# Patient Record
Sex: Male | Born: 1980 | Race: White | Hispanic: No | Marital: Married | Smoking: Current every day smoker
Health system: Southern US, Community
[De-identification: ages and names within clinical notes are randomized; demographics above are authoritative.]

## PROBLEM LIST (undated history)

## (undated) DIAGNOSIS — F419 Anxiety disorder, unspecified: Secondary | ICD-10-CM

## (undated) DIAGNOSIS — K219 Gastro-esophageal reflux disease without esophagitis: Secondary | ICD-10-CM

## (undated) DIAGNOSIS — J45909 Unspecified asthma, uncomplicated: Secondary | ICD-10-CM

## (undated) DIAGNOSIS — Z800 Family history of malignant neoplasm of digestive organs: Secondary | ICD-10-CM

## (undated) DIAGNOSIS — F329 Major depressive disorder, single episode, unspecified: Secondary | ICD-10-CM

## (undated) DIAGNOSIS — F32A Depression, unspecified: Secondary | ICD-10-CM

## (undated) DIAGNOSIS — T7840XA Allergy, unspecified, initial encounter: Secondary | ICD-10-CM

## (undated) DIAGNOSIS — G4730 Sleep apnea, unspecified: Secondary | ICD-10-CM

## (undated) HISTORY — DX: Depression, unspecified: F32.A

## (undated) HISTORY — PX: WISDOM TOOTH EXTRACTION: SHX21

## (undated) HISTORY — DX: Family history of malignant neoplasm of digestive organs: Z80.0

## (undated) HISTORY — DX: Unspecified asthma, uncomplicated: J45.909

## (undated) HISTORY — DX: Allergy, unspecified, initial encounter: T78.40XA

## (undated) HISTORY — DX: Gastro-esophageal reflux disease without esophagitis: K21.9

## (undated) HISTORY — DX: Anxiety disorder, unspecified: F41.9

## (undated) HISTORY — DX: Major depressive disorder, single episode, unspecified: F32.9

---

## 2006-07-15 ENCOUNTER — Ambulatory Visit: Payer: Self-pay | Admitting: Family Medicine

## 2006-08-12 ENCOUNTER — Ambulatory Visit: Payer: Self-pay | Admitting: Family Medicine

## 2007-07-12 ENCOUNTER — Ambulatory Visit: Payer: Self-pay | Admitting: Family Medicine

## 2007-07-12 DIAGNOSIS — K219 Gastro-esophageal reflux disease without esophagitis: Secondary | ICD-10-CM | POA: Insufficient documentation

## 2007-07-12 LAB — CONVERTED CEMR LAB: H Pylori IgG: NEGATIVE

## 2007-07-13 ENCOUNTER — Telehealth (INDEPENDENT_AMBULATORY_CARE_PROVIDER_SITE_OTHER): Payer: Self-pay | Admitting: *Deleted

## 2007-08-02 ENCOUNTER — Telehealth (INDEPENDENT_AMBULATORY_CARE_PROVIDER_SITE_OTHER): Payer: Self-pay | Admitting: *Deleted

## 2009-01-14 ENCOUNTER — Ambulatory Visit: Payer: Self-pay | Admitting: Family Medicine

## 2009-01-14 DIAGNOSIS — J309 Allergic rhinitis, unspecified: Secondary | ICD-10-CM | POA: Insufficient documentation

## 2009-01-14 DIAGNOSIS — F17200 Nicotine dependence, unspecified, uncomplicated: Secondary | ICD-10-CM | POA: Insufficient documentation

## 2009-01-31 ENCOUNTER — Ambulatory Visit: Payer: Self-pay | Admitting: Family Medicine

## 2009-01-31 DIAGNOSIS — H00019 Hordeolum externum unspecified eye, unspecified eyelid: Secondary | ICD-10-CM | POA: Insufficient documentation

## 2012-05-08 ENCOUNTER — Ambulatory Visit
Admission: RE | Admit: 2012-05-08 | Discharge: 2012-05-08 | Disposition: A | Discharge status: Home / Self Care | Payer: No Typology Code available for payment source | Source: Ambulatory Visit | Attending: Occupational Medicine | Admitting: Occupational Medicine

## 2012-05-08 ENCOUNTER — Other Ambulatory Visit: Payer: Self-pay | Admitting: Occupational Medicine

## 2012-05-08 DIAGNOSIS — Z0000 Encounter for general adult medical examination without abnormal findings: Secondary | ICD-10-CM

## 2017-01-05 ENCOUNTER — Ambulatory Visit (INDEPENDENT_AMBULATORY_CARE_PROVIDER_SITE_OTHER): Payer: Commercial Managed Care - HMO

## 2017-01-05 ENCOUNTER — Ambulatory Visit (INDEPENDENT_AMBULATORY_CARE_PROVIDER_SITE_OTHER): Payer: Commercial Managed Care - HMO | Admitting: Family Medicine

## 2017-01-05 ENCOUNTER — Encounter: Payer: Self-pay | Admitting: Family Medicine

## 2017-01-05 VITALS — BP 108/68 | HR 73 | Temp 98.4°F | Ht 69.0 in | Wt 210.0 lb

## 2017-01-05 DIAGNOSIS — G4730 Sleep apnea, unspecified: Secondary | ICD-10-CM | POA: Diagnosis not present

## 2017-01-05 DIAGNOSIS — R0602 Shortness of breath: Secondary | ICD-10-CM

## 2017-01-05 DIAGNOSIS — E669 Obesity, unspecified: Secondary | ICD-10-CM | POA: Diagnosis not present

## 2017-01-05 DIAGNOSIS — F17200 Nicotine dependence, unspecified, uncomplicated: Secondary | ICD-10-CM | POA: Diagnosis not present

## 2017-01-05 DIAGNOSIS — J301 Allergic rhinitis due to pollen: Secondary | ICD-10-CM | POA: Diagnosis not present

## 2017-01-05 DIAGNOSIS — E782 Mixed hyperlipidemia: Secondary | ICD-10-CM | POA: Insufficient documentation

## 2017-01-05 DIAGNOSIS — G4733 Obstructive sleep apnea (adult) (pediatric): Secondary | ICD-10-CM | POA: Insufficient documentation

## 2017-01-05 DIAGNOSIS — F329 Major depressive disorder, single episode, unspecified: Secondary | ICD-10-CM | POA: Insufficient documentation

## 2017-01-05 DIAGNOSIS — R5382 Chronic fatigue, unspecified: Secondary | ICD-10-CM | POA: Diagnosis not present

## 2017-01-05 DIAGNOSIS — F411 Generalized anxiety disorder: Secondary | ICD-10-CM | POA: Insufficient documentation

## 2017-01-05 LAB — LIPID PANEL
Cholesterol: 239 mg/dL — ABNORMAL HIGH (ref 0–200)
HDL: 37.5 mg/dL — ABNORMAL LOW (ref >39.00)
NonHDL: 201.04
Total CHOL/HDL Ratio: 6
Triglycerides: 265 mg/dL — ABNORMAL HIGH (ref 0.0–149.0)
VLDL: 53 mg/dL — ABNORMAL HIGH (ref 0.0–40.0)

## 2017-01-05 LAB — COMPREHENSIVE METABOLIC PANEL
ALT: 32 U/L (ref 0–53)
AST: 26 U/L (ref 0–37)
Albumin: 4.9 g/dL (ref 3.5–5.2)
Alkaline Phosphatase: 64 U/L (ref 39–117)
BUN: 11 mg/dL (ref 6–23)
CO2: 29 mEq/L (ref 19–32)
Calcium: 9.7 mg/dL (ref 8.4–10.5)
Chloride: 105 mEq/L (ref 96–112)
Creatinine, Ser: 1.02 mg/dL (ref 0.40–1.50)
GFR: 87.49 mL/min (ref >60.00)
Glucose, Bld: 87 mg/dL (ref 70–99)
Potassium: 3.9 mEq/L (ref 3.5–5.1)
Sodium: 139 mEq/L (ref 135–145)
Total Bilirubin: 0.7 mg/dL (ref 0.2–1.2)
Total Protein: 7.3 g/dL (ref 6.0–8.3)

## 2017-01-05 LAB — TSH: TSH: 1.77 u[IU]/mL (ref 0.35–4.50)

## 2017-01-05 LAB — TESTOSTERONE: Testosterone: 278.35 ng/dL — ABNORMAL LOW (ref 300.00–890.00)

## 2017-01-05 LAB — LDL CHOLESTEROL, DIRECT: Direct LDL: 147 mg/dL

## 2017-01-05 LAB — T4, FREE: Free T4: 0.82 ng/dL (ref 0.60–1.60)

## 2017-01-05 NOTE — Progress Notes (Signed)
Pre visit review using our clinic review tool, if applicable. No additional management support is needed unless otherwise documented below in the visit note. 

## 2017-01-05 NOTE — Progress Notes (Signed)
Jesse Arellano is a 36 y.o. male is here to [facility] C[facility].   History of Present Illness:    1. Chronic fatigue. Worse over the past few months. Patient is a IT sales professional. Alternates shifts. Has not been feeling up to exercising with his group. Cardiovascular ROS: negative for - chest pain, dyspnea on exertion, edema, irregular heartbeat or palpitations. Endorses chronic stress and anxiety (see below). Endorses apnea symptoms (see below). Smoker. Wants testosterone checked. Endorses ETOH regularly, but less than a six pack per week. No drug use. No supplement use. Sleep 6-11 hours per night.    2. Sleep disorder breathing. Snores loudly and has been told that he stops breathing. Had "uvula trimmed" about 10 years ago. No previous sleep study.    3. Obesity (BMI 30-39.9). He has gained weight over the past year due to lack of exercise and increased eating.     4. Smoker. Daily. Worse when he is anxious.    5. SOB (shortness of breath). Intermittent. Hx of allergies. Smoker. Respiratory ROS: negative for - hemoptysis, pleuritic pain, sputum changes, tachypnea or wheezing.   6. Mixed hyperlipidemia. Hx of. Not on medication.    7. Chronic seasonal allergic rhinitis due to pollen. Zyrtec prn.    8. Reactive depression. Seeing psychology, CBT.    9. Generalized anxiety disorder. Seeing psychology, CBT.     There are no preventive care reminders to display for this patient.   PMHx, SurgHx, SocialHx, Medications, and Allergies were reviewed in the Visit Navigator and updated as appropriate.    Past Medical History:  Diagnosis Date  . Allergy   . Anxiety   . Asthma   . Depression   . GERD (gastroesophageal reflux disease)   . Substance abuse     No past surgical history on file.  Family History  Problem Relation Age of Onset  . Diabetes Mother   . Cancer Father   . Diabetes Sister   . Cancer Sister   . Behavior problems Daughter     Social History  Substance Use  Topics  . Smoking status: Current Every Day Smoker  . Smokeless tobacco: Current User  . Alcohol use 3.0 oz/week    5 Cans of beer per week     Current Medications and Allergies:    Current Outpatient Prescriptions:  .  cetirizine (ZYRTEC) 10 MG tablet, Take 10 mg by mouth daily., Disp: , Rfl:  .  ibuprofen (ADVIL,MOTRIN) 200 MG tablet, Take 400 mg by mouth every 6 (six) hours as needed., Disp: , Rfl:    Allergies  Allergen Reactions  . Penicillins     REACTION: hives and itching as child      Patient Information Form: Screening and ROS    Do you feel safe in relationships? yes PHQ-2: positive  Review of Systems  General:  Negative for unexplained weight loss, fever Skin: Negative for new or changing mole, sore that won't heal HEENT: Negative for trouble hearing, trouble seeing, ringing in ears, mouth sores, hoarseness, change in voice, dysphagia CV:  Negative for chest pain, edema, palpitations Resp: Negative for cough, dyspnea, hemoptysis GI: Negative for nausea, vomiting, diarrhea, constipation, abdominal pain, melena, hematochezia GU: Negative for dysuria, incontinence, urinary hesitance, hematuria, penile discharge, polyuria, sexual difficulty, lumps in testicle MSK: Negative for muscle cramps or aches, joint pain or swelling Neuro: Negative for headaches, weakness, numbness, dizziness, passing out/fainting Psych: Negative for memory problems, SI/HI   Vitals:   Vitals:   01/05/17 1610  BP: 108/68  Pulse: 73  Temp: 98.4 F (36.9 C)  TempSrc: Oral  SpO2: 97%  Weight: 210 lb (95.3 kg)  Height: 5\' 9"  (1.753 m)     Body mass index is 31.01 kg/m.   Physical Exam:     General: Alert, cooperative, appears stated age and no distress.  HEENT:  Normocephalic, without obvious abnormality, atraumatic. Conjunctivae/corneas clear. PERRL, EOM's intact. Normal TM's and external ear canals both ears. Nares normal. Septum midline. Mucosa normal. No drainage or sinus  tenderness. Lips, mucosa, and tongue normal; teeth and gums normal.  Lungs: Clear to auscultation bilaterally.  Heart:: Regular rate and rhythm, S1, S2 normal, no murmur, click, rub or gallop.  Abdomen: Soft, non-tender; bowel sounds normal; no masses,  no organomegaly.  Extremities: Extremities normal, atraumatic, no cyanosis or edema.  Pulses: 2+ and symmetric.  Skin: Skin color, texture, turgor normal. No rashes or lesions.  Neurologic: Alert and oriented X 3, normal strength and tone. Normal symmetric. reflexes. Normal coordination and gait.  Psych: Alert,oriented, in NAD with a full range of affect, normal behavior and no psychotic features     Assessment and Plan:    Madelaine Bhatdam was seen today for establish care and fatigue.  Diagnoses and all orders for this visit:  Chronic fatigue Comments: Labs as below. Home sleep study. Reviewed healthy eating patterns, exercise, and stress reduction. Patient requested testosterone, though not early in am. I did let him know that my first recommendation will be 10% weight loss and treatment of sleep apnea if diagnosed prior to replacement.  Orders: -     Testosterone -     TSH -     T4, free  Sleep disorder breathing Comments: Direct observations from wife and coworkers. Orders: -     Nocturnal polysomnography (NPSG); Future  Obesity (BMI 30-39.9) Comments: As above.  Smoker SOB (shortness of breath) Comments: Clear CXR today. Reviewed precautions. PFTs with CPE each year. Orders: -     DG Chest 2 View  Mixed hyperlipidemia -     Comprehensive metabolic panel -     Lipid panel  Chronic seasonal allergic rhinitis due to pollen  Reactive depression Generalized anxiety disorder Comments: Continue CBT. He has tried multiple medications in the past and would like to avoid them at this time. No SI/HI.    Marland Kitchen. Reviewed expectations re: course of current medical issues. . Discussed self-management of symptoms. . Outlined signs and  symptoms indicating need for more acute intervention. . Patient verbalized understanding and all questions were answered. . See orders for this visit as documented in the electronic medical record. . Patient received an After Visit Summary.  Records requested if needed. I spent 60 minutes with this patient, greater than 50% was face-to-face time counseling regarding the above diagnoses.    Helane RimaErica Maureen Duesing, D.O. [place] [place] 01/05/2017   Follow-up: No Follow-up on file.  Meds ordered this encounter  Medications  . cetirizine (ZYRTEC) 10 MG tablet    Sig: Take 10 mg by mouth daily.  Marland Kitchen. ibuprofen (ADVIL,MOTRIN) 200 MG tablet    Sig: Take 400 mg by mouth every 6 (six) hours as needed.   There are no discontinued medications. Orders Placed This Encounter  Procedures  . DG Chest 2 View  . Comprehensive metabolic panel  . Lipid panel  . Testosterone  . TSH  . T4, free  . Nocturnal polysomnography (NPSG)

## 2017-01-06 ENCOUNTER — Encounter: Payer: Self-pay | Admitting: Family Medicine

## 2017-01-21 ENCOUNTER — Telehealth: Payer: Self-pay | Admitting: Internal Medicine

## 2017-01-21 NOTE — Telephone Encounter (Signed)
I had to leave voice message for patient to return my call to schedule the HST

## 2017-01-27 NOTE — Telephone Encounter (Signed)
Pt has been scheduled with Synetta FailAnita for next Friday 3/23.

## 2017-01-27 NOTE — Telephone Encounter (Signed)
Please advise if pt has been contacted again about HST. Thanks.

## 2017-02-06 DIAGNOSIS — G4733 Obstructive sleep apnea (adult) (pediatric): Secondary | ICD-10-CM

## 2017-02-16 ENCOUNTER — Telehealth: Payer: Self-pay | Admitting: Pulmonary Disease

## 2017-02-16 DIAGNOSIS — G4733 Obstructive sleep apnea (adult) (pediatric): Secondary | ICD-10-CM

## 2017-02-16 NOTE — Telephone Encounter (Signed)
Spoke with patient regarding results. Patient verbalized understanding. Wants to get started on a CPAP. Will place the order for the CPAP titration study.

## 2017-02-16 NOTE — Telephone Encounter (Signed)
Per Dr. Vassie Loll, HST showed severe OSA, especially in the spine position.   Treatment options include weight loss or CPAP therapy. Based on the severity, CPAP titration study should be ordered if he is interested.

## 2017-02-17 ENCOUNTER — Other Ambulatory Visit: Payer: Self-pay | Admitting: *Deleted

## 2017-02-17 DIAGNOSIS — G4730 Sleep apnea, unspecified: Secondary | ICD-10-CM

## 2017-02-20 ENCOUNTER — Encounter: Payer: Self-pay | Admitting: Family Medicine

## 2017-04-07 ENCOUNTER — Encounter (HOSPITAL_BASED_OUTPATIENT_CLINIC_OR_DEPARTMENT_OTHER): Payer: Commercial Managed Care - HMO

## 2017-04-10 IMAGING — DX DG CHEST 2V
2 series · 2 of 2 positions shown · non-contrast
Comparison: None.

CLINICAL DATA: Intermittent shortness of breath for 2-3 months.

EXAM:
CHEST  2 VIEW

[chest pa]
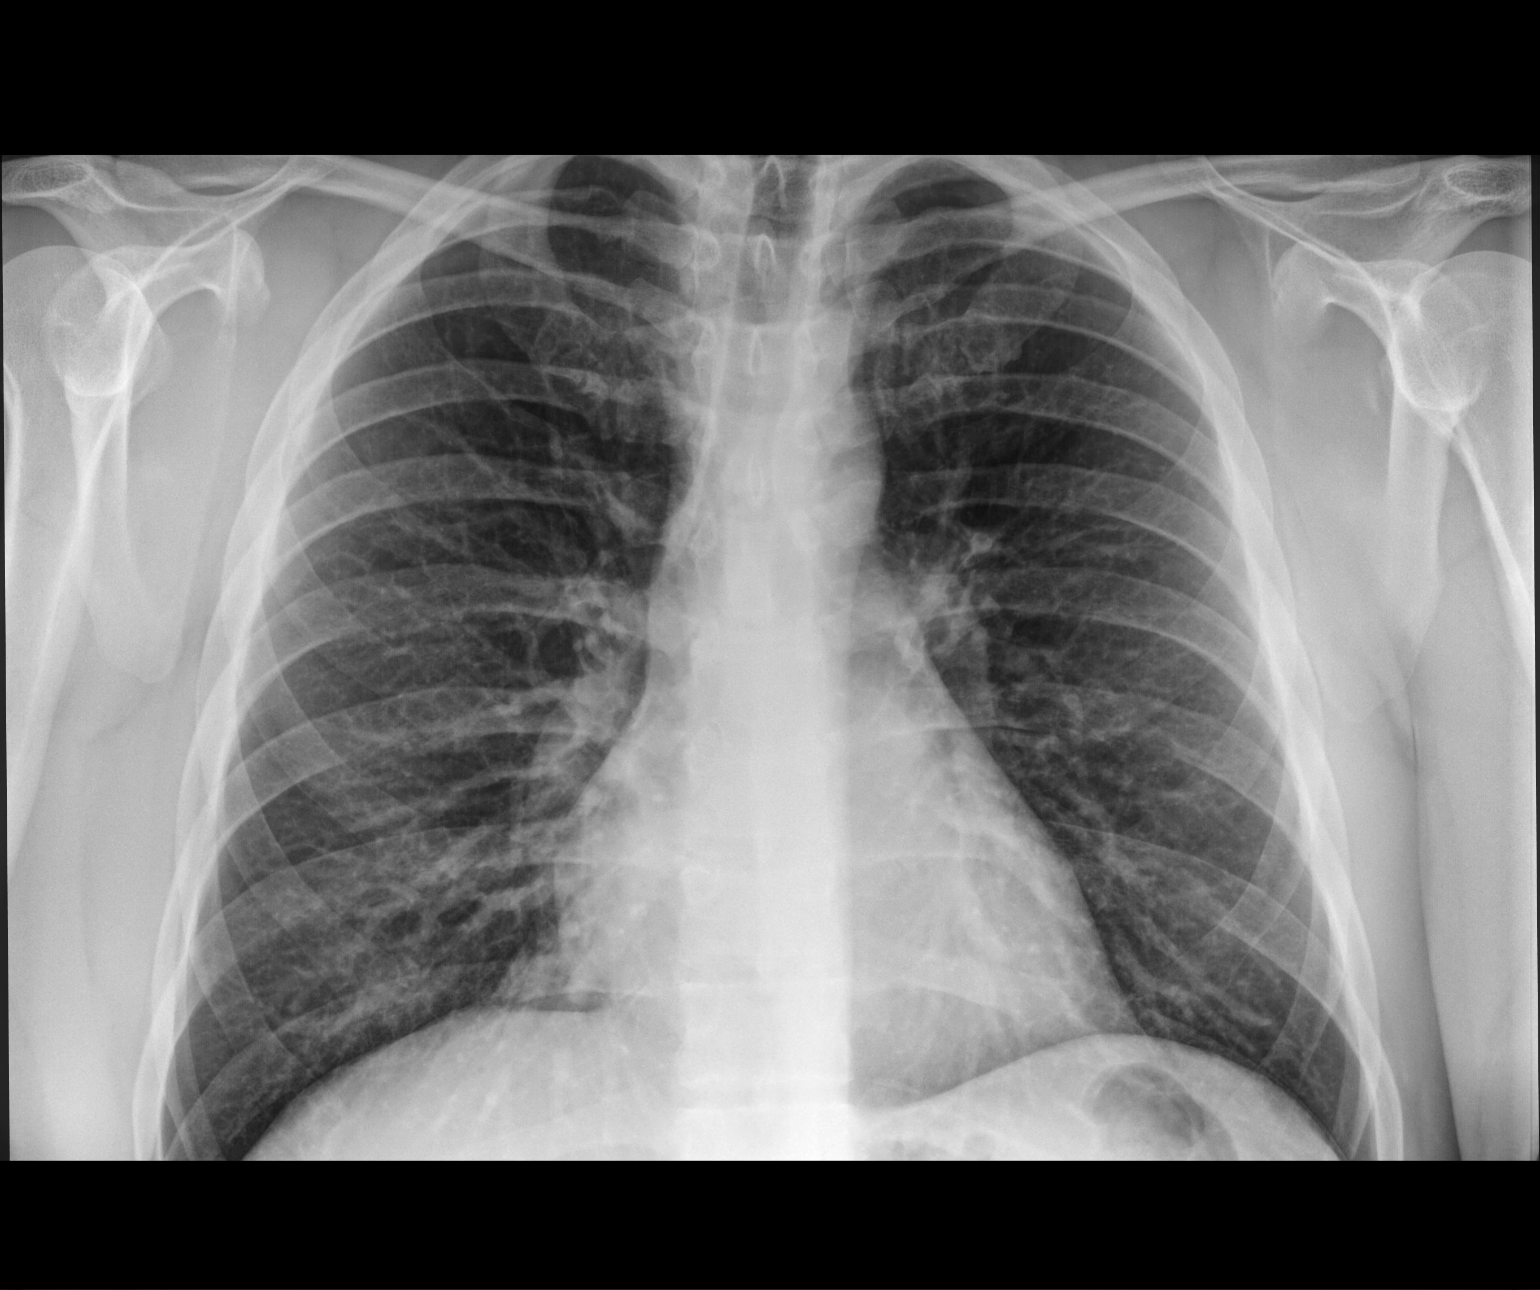

[chest lat]
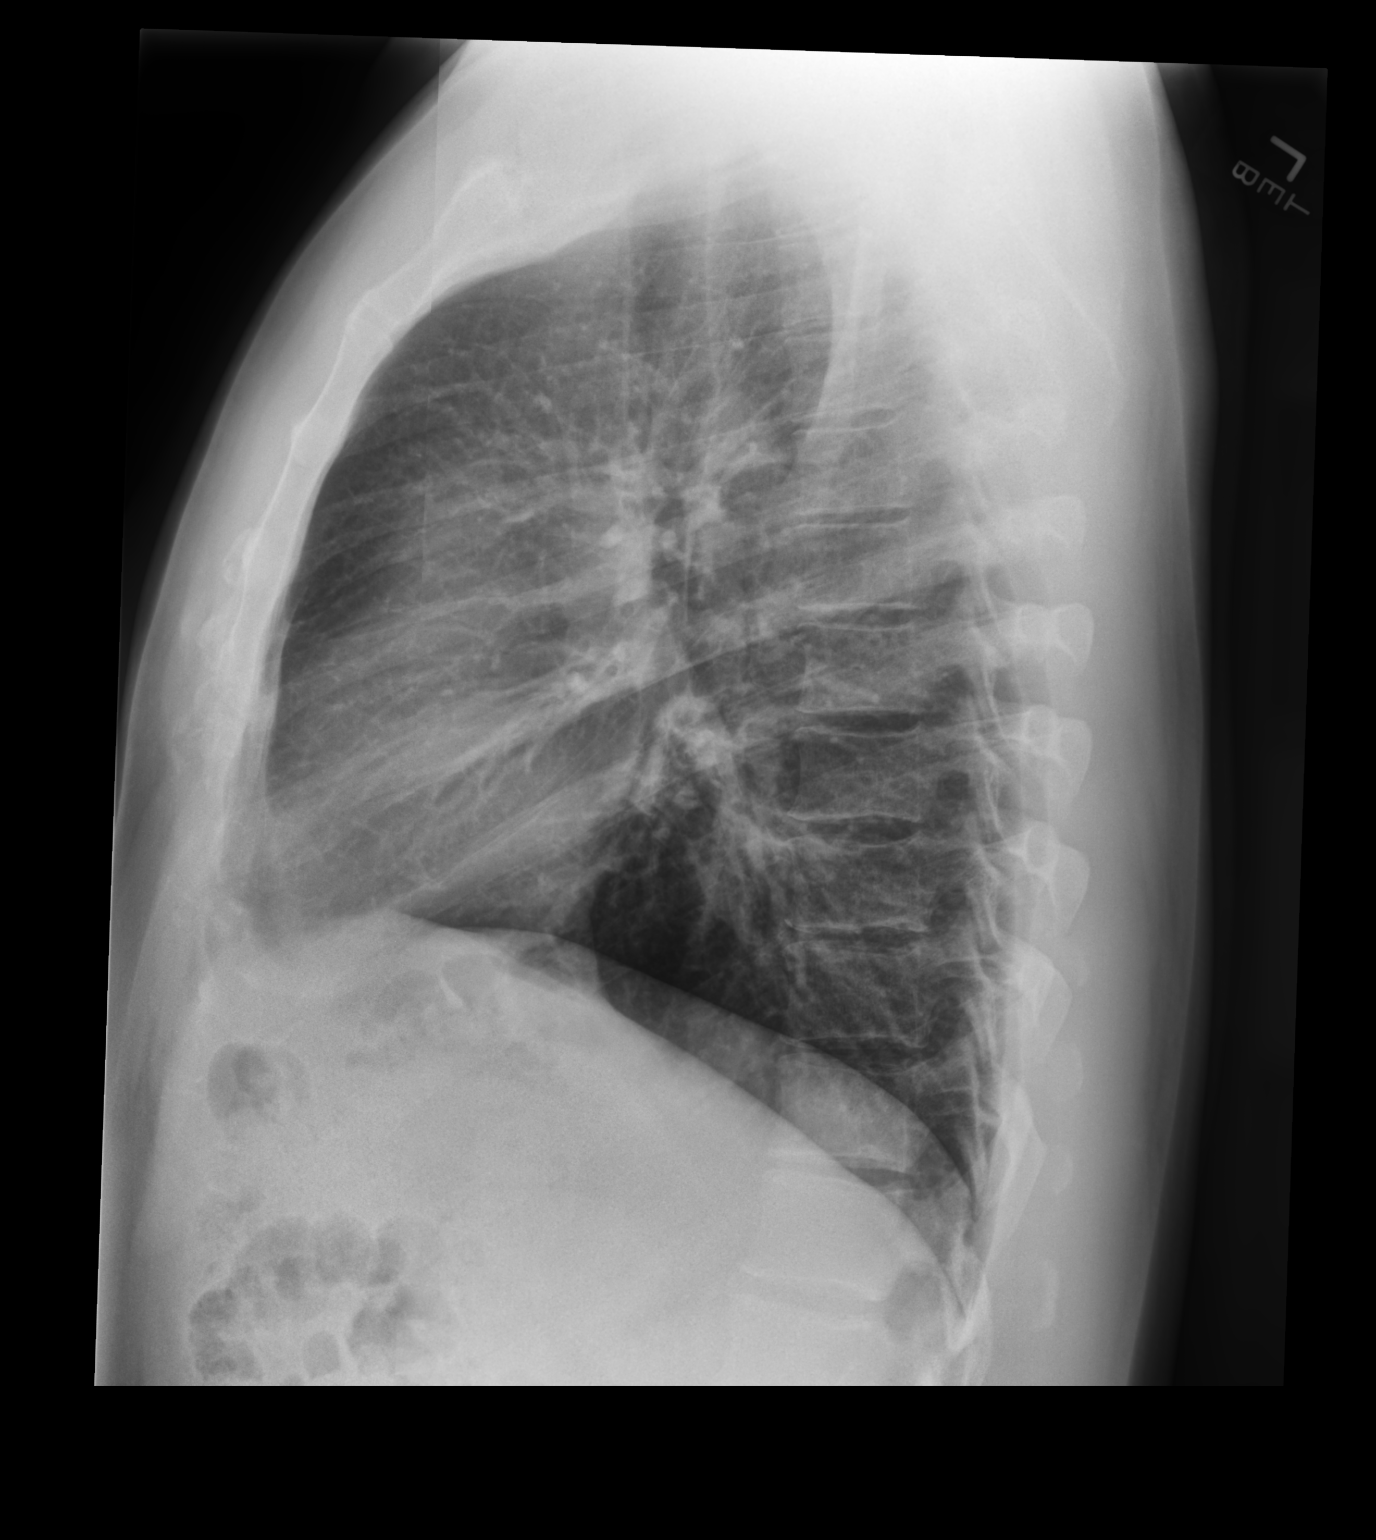

[2 of 2 positions shown; findings below may reference images not displayed]

FINDINGS: Normal heart size and mediastinal contours. No acute infiltrate or
edema. No effusion or pneumothorax. No acute osseous findings.
IMPRESSION: Negative chest.

## 2017-04-27 ENCOUNTER — Encounter (HOSPITAL_BASED_OUTPATIENT_CLINIC_OR_DEPARTMENT_OTHER): Payer: Commercial Managed Care - HMO

## 2018-06-02 ENCOUNTER — Encounter: Payer: Commercial Managed Care - HMO | Admitting: Family Medicine

## 2018-06-25 DIAGNOSIS — R7989 Other specified abnormal findings of blood chemistry: Secondary | ICD-10-CM | POA: Insufficient documentation

## 2018-06-25 NOTE — Progress Notes (Signed)
I acted as a Neurosurgeonscribe for W. R. BerkleyErica Tifany Hirsch, DO Corky Mullonna Orphanos, LPN  Subjective:    Jesse Arellano is a 37 y.o. male who presents today for his Complete Annual Exam.   Current Outpatient Medications:  .  cetirizine (ZYRTEC) 10 MG tablet, Take 10 mg by mouth daily., Disp: , Rfl:   There are no preventive care reminders to display for this patient.  PMHx, SurgHx, SocialHx, Medications, and Allergies were reviewed in the Visit Navigator and updated as appropriate.   Past Medical History:  Diagnosis Date  . Allergy   . Anxiety   . Asthma   . Depression   . GERD (gastroesophageal reflux disease)   . Substance abuse (HCC)    History reviewed. No pertinent surgical history.   Family History  Problem Relation Age of Onset  . Diabetes Mother   . Cancer Father   . Diabetes Sister   . Cancer Sister   . Behavior problems Daughter    Social History   Tobacco Use  . Smoking status: Current Every Day Smoker  . Smokeless tobacco: Current User  Substance Use Topics  . Alcohol use: Yes    Alcohol/week: 5.0 standard drinks    Types: 5 Cans of beer per week  . Drug use: No   Review of Systems:   Pertinent items are noted in the HPI. Otherwise, ROS is negative.  Objective:   Vitals:   06/26/18 0715  BP: 130/90  Pulse: 75  Temp: 98.3 F (36.8 C)  SpO2: 96%   Body mass index is 30.46 kg/m.  General Appearance:  Alert, cooperative, no distress, appears stated age  Head:  Normocephalic, without obvious abnormality, atraumatic  Eyes:  PERRL, conjunctiva/corneas clear, EOM's intact, fundi benign, both eyes       Ears:  Normal TM's and external ear canals, both ears  Nose: Nares normal, septum midline, mucosa normal, no drainage    or sinus tenderness  Throat: Lips, mucosa, and tongue normal; teeth and gums normal  Neck: Supple, symmetrical, trachea midline, no adenopathy; thyroid:  No enlargement/tenderness/nodules; no carotit bruit or JVD  Back:   Symmetric, no  curvature, ROM normal, no CVA tenderness  Lungs:   Clear to auscultation bilaterally, respirations unlabored  Chest wall:  No tenderness or deformity  Heart:  Regular rate and rhythm, S1 and S2 normal, no murmur, rub   or gallop  Abdomen:   Soft, non-tender, bowel sounds active all four quadrants, no masses, no organomegaly  Extremities: Extremities normal, atraumatic, no cyanosis or edema  Prostate: Not done.   Skin: Skin color, texture, turgor normal, no rashes or lesions  Lymph nodes: Cervical, supraclavicular, and axillary nodes normal  Neurologic: CNII-XII grossly intact. Normal strength, sensation and reflexes throughout   Assessment/Plan:   Jesse Arellano was seen today for annual exam.  Diagnoses and all orders for this visit:  Mixed hyperlipidemia -     Comprehensive metabolic panel -     Lipid panel  Low testosterone  OSA (obstructive sleep apnea), severe  Obesity (BMI 30-39.9)  Tonsil and adenoid disease, chronic -     CBC with Differential/Platelet -     Ambulatory referral to ENT  Ganglion cyst -     Ambulatory referral to Orthopedics  Cigarette nicotine dependence without complication -     varenicline (CHANTIX STARTING MONTH PAK) 0.5 MG X 11 & 1 MG X 42 tablet; Take one 0.5 mg tablet by mouth once daily for 3 days, then increase to one 0.5  mg tablet twice daily for 4 days, then increase to one 1 mg tablet twice daily.    Patient Counseling: [x]   Nutrition: Stressed importance of moderation in sodium/caffeine intake, saturated fat and cholesterol, caloric balance, sufficient intake of fresh fruits, vegetables, and fiber.  [x]   Stressed the importance of regular exercise.   []   Substance Abuse: Discussed cessation/primary prevention of tobacco, alcohol, or other drug use; driving or other dangerous activities under the influence; availability of treatment for abuse.   [x]   Injury prevention: Discussed safety belts, safety helmets, smoke detector, smoking near bedding or  upholstery.   []   Sexuality: Discussed sexually transmitted diseases, partner selection, use of condoms, avoidance of unintended pregnancy and contraceptive alternatives.   [x]   Dental health: Discussed importance of regular tooth brushing, flossing, and dental visits.  [x]   Health maintenance and immunizations reviewed. Please refer to Health maintenance section.    Helane Rima, DO [place] [facility]

## 2018-06-26 ENCOUNTER — Ambulatory Visit (INDEPENDENT_AMBULATORY_CARE_PROVIDER_SITE_OTHER): Payer: 59 | Admitting: Family Medicine

## 2018-06-26 ENCOUNTER — Encounter: Payer: Self-pay | Admitting: Family Medicine

## 2018-06-26 VITALS — BP 130/90 | HR 75 | Temp 98.3°F | Ht 69.0 in | Wt 206.2 lb

## 2018-06-26 DIAGNOSIS — R7989 Other specified abnormal findings of blood chemistry: Secondary | ICD-10-CM | POA: Diagnosis not present

## 2018-06-26 DIAGNOSIS — J359 Chronic disease of tonsils and adenoids, unspecified: Secondary | ICD-10-CM | POA: Diagnosis not present

## 2018-06-26 DIAGNOSIS — E782 Mixed hyperlipidemia: Secondary | ICD-10-CM

## 2018-06-26 DIAGNOSIS — G4733 Obstructive sleep apnea (adult) (pediatric): Secondary | ICD-10-CM

## 2018-06-26 DIAGNOSIS — M6740 Ganglion, unspecified site: Secondary | ICD-10-CM

## 2018-06-26 DIAGNOSIS — E669 Obesity, unspecified: Secondary | ICD-10-CM

## 2018-06-26 DIAGNOSIS — F17210 Nicotine dependence, cigarettes, uncomplicated: Secondary | ICD-10-CM

## 2018-06-26 LAB — CBC WITH DIFFERENTIAL/PLATELET
Basophils Absolute: 0 10*3/uL (ref 0.0–0.1)
Basophils Relative: 0.6 % (ref 0.0–3.0)
Eosinophils Absolute: 0.4 10*3/uL (ref 0.0–0.7)
Eosinophils Relative: 6 % — ABNORMAL HIGH (ref 0.0–5.0)
HCT: 44.9 % (ref 39.0–52.0)
Hemoglobin: 15.7 g/dL (ref 13.0–17.0)
Lymphocytes Relative: 23.6 % (ref 12.0–46.0)
Lymphs Abs: 1.5 10*3/uL (ref 0.7–4.0)
MCHC: 35.1 g/dL (ref 30.0–36.0)
MCV: 87.8 fl (ref 78.0–100.0)
Monocytes Absolute: 0.6 10*3/uL (ref 0.1–1.0)
Monocytes Relative: 9 % (ref 3.0–12.0)
Neutro Abs: 3.9 10*3/uL (ref 1.4–7.7)
Neutrophils Relative %: 60.8 % (ref 43.0–77.0)
Platelets: 206 10*3/uL (ref 150.0–400.0)
RBC: 5.11 Mil/uL (ref 4.22–5.81)
RDW: 13.6 % (ref 11.5–15.5)
WBC: 6.4 10*3/uL (ref 4.0–10.5)

## 2018-06-26 LAB — LIPID PANEL
Cholesterol: 213 mg/dL — ABNORMAL HIGH (ref 0–200)
HDL: 35.6 mg/dL — ABNORMAL LOW (ref >39.00)
NonHDL: 177.31
Total CHOL/HDL Ratio: 6
Triglycerides: 261 mg/dL — ABNORMAL HIGH (ref 0.0–149.0)
VLDL: 52.2 mg/dL — ABNORMAL HIGH (ref 0.0–40.0)

## 2018-06-26 LAB — COMPREHENSIVE METABOLIC PANEL
ALT: 23 U/L (ref 0–53)
AST: 19 U/L (ref 0–37)
Albumin: 4.7 g/dL (ref 3.5–5.2)
Alkaline Phosphatase: 61 U/L (ref 39–117)
BUN: 14 mg/dL (ref 6–23)
CO2: 28 mEq/L (ref 19–32)
Calcium: 10 mg/dL (ref 8.4–10.5)
Chloride: 105 mEq/L (ref 96–112)
Creatinine, Ser: 1.23 mg/dL (ref 0.40–1.50)
GFR: 69.93 mL/min (ref >60.00)
Glucose, Bld: 93 mg/dL (ref 70–99)
Potassium: 4.1 mEq/L (ref 3.5–5.1)
Sodium: 140 mEq/L (ref 135–145)
Total Bilirubin: 0.8 mg/dL (ref 0.2–1.2)
Total Protein: 7 g/dL (ref 6.0–8.3)

## 2018-06-26 LAB — LDL CHOLESTEROL, DIRECT: Direct LDL: 146 mg/dL

## 2018-06-26 MED ORDER — VARENICLINE TARTRATE 0.5 MG X 11 & 1 MG X 42 PO MISC: ORAL | 0 refills | Status: DC

## 2018-06-27 ENCOUNTER — Ambulatory Visit (INDEPENDENT_AMBULATORY_CARE_PROVIDER_SITE_OTHER): Payer: 59 | Admitting: Orthopaedic Surgery

## 2018-06-27 ENCOUNTER — Ambulatory Visit (INDEPENDENT_AMBULATORY_CARE_PROVIDER_SITE_OTHER): Payer: Self-pay

## 2018-06-27 ENCOUNTER — Encounter (INDEPENDENT_AMBULATORY_CARE_PROVIDER_SITE_OTHER): Payer: Self-pay | Admitting: Orthopaedic Surgery

## 2018-06-27 DIAGNOSIS — M25532 Pain in left wrist: Secondary | ICD-10-CM
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
## 2018-07-10 DIAGNOSIS — J351 Hypertrophy of tonsils: Secondary | ICD-10-CM | POA: Insufficient documentation

## 2018-07-13 ENCOUNTER — Encounter: Payer: Self-pay | Admitting: Family Medicine

## 2018-08-01 ENCOUNTER — Encounter: Payer: Self-pay | Admitting: Family Medicine

## 2018-08-03 ENCOUNTER — Other Ambulatory Visit: Payer: Self-pay

## 2018-08-03 DIAGNOSIS — G4733 Obstructive sleep apnea (adult) (pediatric): Secondary | ICD-10-CM

## 2018-08-03 DIAGNOSIS — J351 Hypertrophy of tonsils: Secondary | ICD-10-CM

## 2018-08-04 MED ORDER — AMPHETAMINE-DEXTROAMPHET ER 10 MG PO CP24: 10.0000 mg | ORAL_CAPSULE | Freq: Every day | ORAL | 0 refills | Status: DC

## 2018-08-04 NOTE — Addendum Note (Signed)
Addended by: Helane RimaWALLACE, Zykiria Bruening R on: 08/04/2018 02:11 PM   Modules accepted: Orders

## 2018-08-04 NOTE — Telephone Encounter (Signed)
Pt called in and states that he went to pick up Adderall and it is on back order.  Pharmacy states it could be 5 to 7 days before they have it in.  Pt also has questions about the difference between Adderall and Adderall XR.    Pt can be reached at [phone].

## 2018-08-04 NOTE — Telephone Encounter (Signed)
See note

## 2018-08-07 NOTE — Telephone Encounter (Signed)
Can you send in to the [facility]?

## 2018-08-27 NOTE — Progress Notes (Signed)
Jesse Arellano is a 37 y.o. male is here for follow up.  History of Present Illness:   HPI:   1. OSA (obstructive sleep apnea), severe. Went to ENT. Told that he is a candidate for surgery - tonsils, adenoids, uvula, turbinates and that it would likely help the OSA.    2. Mixed hyperlipidemia.   Lab Results  Component Value Date   CHOL 213 (H) 06/26/2018   HDL 35.60 (L) 06/26/2018   LDLDIRECT 146.0 06/26/2018   TRIG (H) 06/26/2018    261.0 Triglyceride is over 400; calculations on Lipids are invalid.   CHOLHDL 6 06/26/2018    3. Obesity (BMI 30-39.9).   Wt Readings from Last 3 Encounters:  08/28/18 208 lb 9.6 oz (94.6 kg)  06/26/18 206 lb 4 oz (93.6 kg)  01/05/17 210 lb (95.3 kg)    4. Cigarette nicotine dependence without complication. Tried Chantix, didn't help. Has decreased amount.     5. Attention deficit hyperactivity disorder (ADHD), combined type.       Adult ADHD Self Report Scale (most recent)    Adult ADHD Self-Report Scale (ASRS-v1.1) Symptom Checklist - 08/28/18 0857      Part A   1. How often do you have trouble wrapping up the final details of a project, once the challenging parts have been done?  (!) Often  2. How often do you have difficulty getting things done in order when you have to do a task that requires organization?  (!) Often    3. How often do you have problems remembering appointments or obligations?  (!) Sometimes  4. When you have a task that requires a lot of thought, how often do you avoid or delay getting started?  (!) Very Often    5. How often do you fidget or squirm with your hands or feet when you have to sit down for a long time?  Sometimes  6. How often do you feel overly active and compelled to do things, like you were driven by a motor?  (!) Often      Part B   7. How often do you make careless mistakes when you have to work on a boring or difficult project?  (!) Often  8. How often do you have difficulty keeping your  attention when you are doing boring or repetitive work?  (!) Often    9. How often do you have difficulty concentrating on what people say to you, even when they are speaking to you directly?  (!) Very Often  10. How often do you misplace or have difficulty finding things at home or at work?  Sometimes    11. How often are you distracted by activity or noise around you?  (!) Very Often  12. How often do you leave your seat in meetings or other situations in which you are expected to remain seated?  (!) Sometimes    13. How often do you feel restless or fidgety?  (!) Often  14. How often do you have difficulty unwinding and relaxing when you have time to yourself?  (!) Very Often    15. How often do you find yourself talking too much when you are in social situations?  (!) Often  16. When you are in a conversation, how often do you find yourself finishing the sentences of the people you are talking to, before they can finish them themselves?  (!) Very Often    17. How often do you  have difficulty waiting your turn in situations when turn taking is required?  (!) Very Often  18. How often do you interrupt others when they are busy?  (!) Often      There are no preventive care reminders to display for this patient.   Depression screen [facility] 2/9 06/26/2018 01/05/2017  Decreased Interest 0 2  Down, Depressed, Hopeless 0 0  PHQ - 2 Score 0 2  Altered sleeping 2 3  Tired, decreased energy 2 3  Change in appetite 1 2  Feeling bad or failure about yourself  0 1  Trouble concentrating 0 1  Moving slowly or fidgety/restless 0 0  Suicidal thoughts 0 0  PHQ-9 Score 5 12  Difficult doing work/chores Somewhat difficult -   PMHx, SurgHx, SocialHx, FamHx, Medications, and Allergies were reviewed in the Visit Navigator and updated as appropriate.   Patient Active Problem List   Diagnosis Date Noted  . Enlarged tonsils 07/10/2018  . Low testosterone 06/25/2018  . Obesity (BMI 30-39.9) 01/05/2017  . Mixed  hyperlipidemia 01/05/2017  . 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 Generalized anxiety disorder 01/05/2017  . Obstructive sleep apnea 01/05/2017  . Smoker 01/14/2009  . Allergic rhinitis 01/14/2009   Social History   Tobacco Use  . Smoking status: Current Every Day Smoker  . Smokeless tobacco: Current User  Substance Use Topics  . Alcohol use: Yes    Alcohol/week: 5.0 standard drinks    Types: 5 Cans of beer per week  . Drug use: No   Current Medications and Allergies:   .  amphetamine-dextroamphetamine (ADDERALL XR) 10 MG 24 hr capsule, Take 1 capsule (10 mg total) by mouth daily., Disp: 30 capsule, Rfl: 0 .  cetirizine (ZYRTEC) 10 MG tablet, Take 10 mg by mouth daily., Disp: , Rfl:  .  ibuprofen (ADVIL,MOTRIN) 200 MG tablet, Take 400 mg by mouth every 6 (six) hours as needed., Disp: , Rfl:  .  varenicline (CHANTIX STARTING MONTH PAK) 0.5 MG X 11 & 1 MG X 42 tablet, Take one 0.5 mg tablet by mouth once daily for 3 days, then increase to one 0.5 mg tablet twice daily for 4 days, then increase to one 1 mg tablet twice daily., Disp: 42 tablet, Rfl: 0   Allergies  Allergen Reactions  . Penicillins     REACTION: hives and itching as child   Review of Systems   Pertinent items are noted in the HPI. Otherwise, ROS is negative.  Vitals:   Vitals:   08/28/18 0819  BP: 108/78  Pulse: 74  Temp: 98.6 F (37 C)  TempSrc: Oral  SpO2: 97%  Weight: 208 lb 9.6 oz (94.6 kg)  Height: 5\' 9"  (1.753 m)     Body mass index is 30.8 kg/m.  Physical Exam:   Physical Exam  Constitutional: He is oriented to person, place, and time. He appears well-developed and well-nourished. No distress.  HENT:  Head: Normocephalic and atraumatic.  Right Ear: External ear normal.  Left Ear: External ear normal.  Nose: Nose normal.  Mouth/Throat: Oropharynx is clear and moist.  Eyes: Pupils are equal, round, and reactive to light. Conjunctivae and EOM are normal.  Neck: Normal range of motion. Neck  supple.  Cardiovascular: Normal rate, regular rhythm, normal heart sounds and intact distal pulses.  Pulmonary/Chest: Effort normal and breath sounds normal.  Abdominal: Soft. Bowel sounds are normal.  Musculoskeletal: Normal range of motion.  Neurological: He is alert and oriented to person, place, and time.  Skin:  Skin is warm and dry.  Psychiatric: He has a normal mood and affect. His behavior is normal. Judgment and thought content normal.  Nursing note and vitals reviewed.  Results for orders placed or performed in visit on 06/26/18  CBC with Differential/Platelet  Result Value Ref Range   WBC 6.4 4.0 - 10.5 K/uL   RBC 5.11 4.22 - 5.81 Mil/uL   Hemoglobin 15.7 13.0 - 17.0 g/dL   HCT 16.1 09.6 - 04.5 %   MCV 87.8 78.0 - 100.0 fl   MCHC 35.1 30.0 - 36.0 g/dL   RDW 40.9 81.1 - 91.4 %   Platelets 206.0 150.0 - 400.0 K/uL   Neutrophils Relative % 60.8 43.0 - 77.0 %   Lymphocytes Relative 23.6 12.0 - 46.0 %   Monocytes Relative 9.0 3.0 - 12.0 %   Eosinophils Relative 6.0 (H) 0.0 - 5.0 %   Basophils Relative 0.6 0.0 - 3.0 %   Neutro Abs 3.9 1.4 - 7.7 K/uL   Lymphs Abs 1.5 0.7 - 4.0 K/uL   Monocytes Absolute 0.6 0.1 - 1.0 K/uL   Eosinophils Absolute 0.4 0.0 - 0.7 K/uL   Basophils Absolute 0.0 0.0 - 0.1 K/uL  Comprehensive metabolic panel  Result Value Ref Range   Sodium 140 135 - 145 mEq/L   Potassium 4.1 3.5 - 5.1 mEq/L   Chloride 105 96 - 112 mEq/L   CO2 28 19 - 32 mEq/L   Glucose, Bld 93 70 - 99 mg/dL   BUN 14 6 - 23 mg/dL   Creatinine, Ser 7.82 0.40 - 1.50 mg/dL   Total Bilirubin 0.8 0.2 - 1.2 mg/dL   Alkaline Phosphatase 61 39 - 117 U/L   AST 19 0 - 37 U/L   ALT 23 0 - 53 U/L   Total Protein 7.0 6.0 - 8.3 g/dL   Albumin 4.7 3.5 - 5.2 g/dL   Calcium 95.6 8.4 - 21.3 mg/dL   GFR 08.65 >78.46 mL/min  Lipid panel  Result Value Ref Range   Cholesterol 213 (H) 0 - 200 mg/dL   Triglycerides (H) 0.0 - 149.0 mg/dL    962.9 Triglyceride is over 400; calculations on Lipids  are invalid.   HDL 35.60 (L) >39.00 mg/dL   VLDL 52.8 (H) 0.0 - 41.3 mg/dL   Total CHOL/HDL Ratio 6    NonHDL 177.31   LDL cholesterol, direct  Result Value Ref Range   Direct LDL 146.0 mg/dL   Assessment and Plan:   Ritter was seen today for follow-up.  Diagnoses and all orders for this visit:  OSA (obstructive sleep apnea), severe Comments: Patient unhappy with ENT but wants the surgery. He has a new appointment next month.  Mixed hyperlipidemia Comments: Patient will need treatment. He wants to wait.   Obesity (BMI 30-39.9) Comments: Reviewed the importance of sleep.  Cigarette nicotine dependence without complication Comments: Discussed other treatment methods. Precontemplative.   Hypersomnia Comments: Sleep hygeine reviewed.   Attention deficit hyperactivity disorder (ADHD), combined type Comments: New Dx. Adderall helpful. Will titrate to therapeutic dose. No noted side effects.  Orders: -     amphetamine-dextroamphetamine (ADDERALL XR) 20 MG 24 hr capsule; Take 1 capsule (20 mg total) by mouth 2 (two) times daily.    . Reviewed expectations re: course of current medical issues. . Discussed self-management of symptoms. . Outlined signs and symptoms indicating need for more acute intervention. . Patient verbalized understanding and all questions were answered. Marland Kitchen Health Maintenance issues including appropriate healthy diet, exercise,  and smoking avoidance were discussed with patient. . See orders for this visit as documented in the electronic medical record. . Patient received an After Visit Summary.  Helane Rima, DO [place], [facility] [place] [facility] 08/29/2018

## 2018-08-28 ENCOUNTER — Ambulatory Visit: Payer: 59 | Admitting: Family Medicine

## 2018-08-28 ENCOUNTER — Encounter: Payer: Self-pay | Admitting: Family Medicine

## 2018-08-28 VITALS — BP 108/78 | HR 74 | Temp 98.6°F | Ht 69.0 in | Wt 208.6 lb

## 2018-08-28 DIAGNOSIS — E669 Obesity, unspecified: Secondary | ICD-10-CM | POA: Diagnosis not present

## 2018-08-28 DIAGNOSIS — F17210 Nicotine dependence, cigarettes, uncomplicated: Secondary | ICD-10-CM | POA: Diagnosis not present

## 2018-08-28 DIAGNOSIS — G4733 Obstructive sleep apnea (adult) (pediatric): Secondary | ICD-10-CM

## 2018-08-28 DIAGNOSIS — F902 Attention-deficit hyperactivity disorder, combined type: Secondary | ICD-10-CM

## 2018-08-28 DIAGNOSIS — E782 Mixed hyperlipidemia: Secondary | ICD-10-CM | POA: Diagnosis not present

## 2018-08-28 DIAGNOSIS — G4710 Hypersomnia, unspecified: Secondary | ICD-10-CM

## 2018-08-28 MED ORDER — AMPHETAMINE-DEXTROAMPHET ER 20 MG PO CP24
20.0000 mg | ORAL_CAPSULE | Freq: Two times a day (BID) | ORAL | 0 refills | Status: DC
Start: 2018-08-28 — End: 2018-08-29

## 2018-08-29 ENCOUNTER — Encounter: Payer: Self-pay | Admitting: Family Medicine

## 2018-08-29 MED ORDER — AMPHETAMINE-DEXTROAMPHET ER 20 MG PO CP24: 20.0000 mg | ORAL_CAPSULE | ORAL | 0 refills | Status: DC

## 2018-08-30 ENCOUNTER — Telehealth: Payer: Self-pay | Admitting: Family Medicine

## 2018-08-30 NOTE — Telephone Encounter (Signed)
Copied from CRM [id number]. Topic: General - Other >> Aug 30, 2018  5:10 PM Stephannie Li, NT wrote: Reason for CRM: Patient called and would  like clear instructions on how to take the amphetamine-dextroamphetamine (ADDERALL XR) 20 MG 24 hr capsule , his understanding is to take 2 once a day per his conversation with Dr  Earlene Plater 20 mg in the morning and 20 mg at 1:00 pm , he would like to know if the dose will be corrected before his next visit .,he  would like a call back from Dr Philis Pique nurse at [phone] to discuss further .

## 2018-08-31 NOTE — Telephone Encounter (Signed)
Spoke to pt told him according to last message you should be taking Adderall ER 20 mg one capsule daily. Pt verbalized understanding but wanted to know if this is the dose for the next 3 months or our we working on getting medication approved for 20 mg twice a day as originally ordered. Told pt I am not sure but will give message to Dr. Philis Pique nurse and have her get back to you. Pt verbalized understanding.

## 2018-09-04 NOTE — Telephone Encounter (Addendum)
Spoke with pharmacy and they had not submitted for the PA yet. They are sending the PA over.

## 2018-09-19 ENCOUNTER — Encounter: Payer: Self-pay | Admitting: Family Medicine

## 2018-09-20 NOTE — Telephone Encounter (Signed)
Jesse Arellano Key: [id number] - PA Case ID: [id number]  Need help? Call us at [phone]    Outcome  Approvedtoday Request Reference Number: [id number]. ADDERALL XR CAP 20MG  is approved through 09/21/2019. For further questions, call [phone].   Drug Adderall XR 20MG  er capsules Form [employer] Electronic Prior Authorization Form (2017 NCPDP)  My chart sent message sent.

## 2018-09-26 MED ORDER — AMPHETAMINE-DEXTROAMPHET ER 20 MG PO CP24: 20.0000 mg | ORAL_CAPSULE | Freq: Two times a day (BID) | ORAL | 0 refills | Status: DC

## 2018-11-20 ENCOUNTER — Other Ambulatory Visit: Payer: Self-pay | Admitting: Family Medicine

## 2018-11-20 NOTE — Telephone Encounter (Signed)
Copied from CRM [phone]. Topic: Quick Communication - Rx Refill/Question >> Nov 20, 2018 12:47 PM Percival SpanishKennedy, Cheryl W wrote: Medication   amphetamine-dextroamphetamine (ADDERALL XR) 20 MG 24 hr capsule   Preferred Pharmacy  [facility] [address] [place]   Agent: Please be advised that RX refills may take up to 3 business days. We ask that you follow-up with your pharmacy.

## 2018-11-20 NOTE — Telephone Encounter (Signed)
See note

## 2018-11-21 MED ORDER — AMPHETAMINE-DEXTROAMPHET ER 20 MG PO CP24: 20.0000 mg | ORAL_CAPSULE | Freq: Two times a day (BID) | ORAL | 0 refills | Status: DC

## 2018-11-21 NOTE — Telephone Encounter (Signed)
Last seen in office 08/28/18  No f/u  Do you want me to call for app?

## 2018-12-11 ENCOUNTER — Other Ambulatory Visit: Payer: Self-pay | Admitting: Otolaryngology

## 2019-01-15 ENCOUNTER — Other Ambulatory Visit: Payer: Self-pay

## 2019-01-15 ENCOUNTER — Encounter (HOSPITAL_BASED_OUTPATIENT_CLINIC_OR_DEPARTMENT_OTHER): Payer: Self-pay | Admitting: *Deleted

## 2019-01-22 ENCOUNTER — Encounter (HOSPITAL_BASED_OUTPATIENT_CLINIC_OR_DEPARTMENT_OTHER): Payer: Self-pay | Admitting: Emergency Medicine

## 2019-01-22 ENCOUNTER — Ambulatory Visit (HOSPITAL_BASED_OUTPATIENT_CLINIC_OR_DEPARTMENT_OTHER): Payer: 59 | Admitting: Anesthesiology

## 2019-01-22 ENCOUNTER — Ambulatory Visit (HOSPITAL_BASED_OUTPATIENT_CLINIC_OR_DEPARTMENT_OTHER)
Admission: RE | Admit: 2019-01-22 | Discharge: 2019-01-22 | Disposition: A | Discharge status: Home / Self Care | Payer: 59 | Attending: Otolaryngology | Admitting: Otolaryngology

## 2019-01-22 ENCOUNTER — Other Ambulatory Visit: Payer: Self-pay

## 2019-01-22 ENCOUNTER — Encounter (HOSPITAL_BASED_OUTPATIENT_CLINIC_OR_DEPARTMENT_OTHER)
Admission: RE | Disposition: A | Discharge status: Home / Self Care | Payer: Self-pay | Source: Home / Self Care | Attending: Otolaryngology

## 2019-01-22 DIAGNOSIS — J342 Deviated nasal septum: Secondary | ICD-10-CM | POA: Insufficient documentation

## 2019-01-22 DIAGNOSIS — G4730 Sleep apnea, unspecified: Secondary | ICD-10-CM | POA: Insufficient documentation

## 2019-01-22 DIAGNOSIS — F17200 Nicotine dependence, unspecified, uncomplicated: Secondary | ICD-10-CM | POA: Diagnosis not present

## 2019-01-22 DIAGNOSIS — K219 Gastro-esophageal reflux disease without esophagitis: Secondary | ICD-10-CM | POA: Insufficient documentation

## 2019-01-22 DIAGNOSIS — F419 Anxiety disorder, unspecified: Secondary | ICD-10-CM | POA: Diagnosis not present

## 2019-01-22 DIAGNOSIS — F988 Other specified behavioral and emotional disorders with onset usually occurring in childhood and adolescence: Secondary | ICD-10-CM | POA: Diagnosis not present

## 2019-01-22 DIAGNOSIS — J3489 Other specified disorders of nose and nasal sinuses: Secondary | ICD-10-CM | POA: Diagnosis not present

## 2019-01-22 DIAGNOSIS — F329 Major depressive disorder, single episode, unspecified: Secondary | ICD-10-CM | POA: Insufficient documentation

## 2019-01-22 DIAGNOSIS — J343 Hypertrophy of nasal turbinates: Secondary | ICD-10-CM | POA: Insufficient documentation

## 2019-01-22 HISTORY — DX: Sleep apnea, unspecified: G47.30

## 2019-01-22 HISTORY — PX: NASAL SEPTOPLASTY W/ TURBINOPLASTY: SHX2070

## 2019-01-22 SURGERY — SEPTOPLASTY, NOSE, WITH NASAL TURBINATE REDUCTION
Anesthesia: General | Site: Nose | Laterality: Bilateral

## 2019-01-22 MED ORDER — SUGAMMADEX SODIUM 200 MG/2ML IV SOLN
INTRAVENOUS | Status: AC
  Filled 2019-01-22: qty 2

## 2019-01-22 MED ORDER — LIDOCAINE-EPINEPHRINE 1 %-1:100000 IJ SOLN
INTRAMUSCULAR | Status: DC | PRN
  Administered 2019-01-22: 3 mL

## 2019-01-22 MED ORDER — OXYMETAZOLINE HCL 0.05 % NA SOLN
NASAL | Status: DC | PRN
  Administered 2019-01-22: 1 via TOPICAL

## 2019-01-22 MED ORDER — MIDAZOLAM HCL 2 MG/2ML IJ SOLN
1.0000 mg | INTRAMUSCULAR | Status: DC | PRN
  Administered 2019-01-22: 2 mg via INTRAVENOUS

## 2019-01-22 MED ORDER — FENTANYL CITRATE (PF) 100 MCG/2ML IJ SOLN
INTRAMUSCULAR | Status: AC
  Filled 2019-01-22: qty 2

## 2019-01-22 MED ORDER — FENTANYL CITRATE (PF) 100 MCG/2ML IJ SOLN
50.0000 ug | INTRAMUSCULAR | Status: AC | PRN
  Administered 2019-01-22: 100 ug via INTRAVENOUS
  Administered 2019-01-22 (×2): 50 ug via INTRAVENOUS

## 2019-01-22 MED ORDER — ONDANSETRON HCL 4 MG/2ML IJ SOLN
INTRAMUSCULAR | Status: AC
  Filled 2019-01-22: qty 2

## 2019-01-22 MED ORDER — OXYCODONE-ACETAMINOPHEN 5-325 MG PO TABS: 1.0000 | ORAL_TABLET | ORAL | 0 refills | Status: DC | PRN

## 2019-01-22 MED ORDER — SUGAMMADEX SODIUM 200 MG/2ML IV SOLN
INTRAVENOUS | Status: DC | PRN
  Administered 2019-01-22: 200 mg via INTRAVENOUS

## 2019-01-22 MED ORDER — ONDANSETRON HCL 4 MG/2ML IJ SOLN
INTRAMUSCULAR | Status: DC | PRN
  Administered 2019-01-22: 4 mg via INTRAVENOUS

## 2019-01-22 MED ORDER — DEXAMETHASONE SODIUM PHOSPHATE 4 MG/ML IJ SOLN
INTRAMUSCULAR | Status: DC | PRN
  Administered 2019-01-22: 10 mg via INTRAVENOUS

## 2019-01-22 MED ORDER — PROPOFOL 10 MG/ML IV BOLUS
INTRAVENOUS | Status: DC | PRN
  Administered 2019-01-22: 200 mg via INTRAVENOUS

## 2019-01-22 MED ORDER — MIDAZOLAM HCL 2 MG/2ML IJ SOLN
INTRAMUSCULAR | Status: AC
  Filled 2019-01-22: qty 2

## 2019-01-22 MED ORDER — LACTATED RINGERS IV SOLN
INTRAVENOUS | Status: DC
  Administered 2019-01-22 (×2): via INTRAVENOUS

## 2019-01-22 MED ORDER — LIDOCAINE 2% (20 MG/ML) 5 ML SYRINGE
INTRAMUSCULAR | Status: AC
  Filled 2019-01-22: qty 5

## 2019-01-22 MED ORDER — CLINDAMYCIN PHOSPHATE 900 MG/50ML IV SOLN
INTRAVENOUS | Status: DC | PRN
  Administered 2019-01-22: 900 mg via INTRAVENOUS

## 2019-01-22 MED ORDER — CLINDAMYCIN PHOSPHATE 900 MG/50ML IV SOLN
INTRAVENOUS | Status: AC
  Filled 2019-01-22: qty 50

## 2019-01-22 MED ORDER — CLINDAMYCIN HCL 300 MG PO CAPS: 300.0000 mg | ORAL_CAPSULE | Freq: Three times a day (TID) | ORAL | 0 refills | Status: AC

## 2019-01-22 MED ORDER — LIDOCAINE HCL (CARDIAC) PF 100 MG/5ML IV SOSY
PREFILLED_SYRINGE | INTRAVENOUS | Status: DC | PRN
  Administered 2019-01-22: 100 mg via INTRAVENOUS

## 2019-01-22 MED ORDER — FENTANYL CITRATE (PF) 100 MCG/2ML IJ SOLN
25.0000 ug | INTRAMUSCULAR | Status: DC | PRN
  Administered 2019-01-22 (×2): 50 ug via INTRAVENOUS

## 2019-01-22 MED ORDER — PROMETHAZINE HCL 25 MG/ML IJ SOLN: 6.2500 mg | INTRAMUSCULAR | Status: DC | PRN

## 2019-01-22 MED ORDER — ARTIFICIAL TEARS OPHTHALMIC OINT
TOPICAL_OINTMENT | OPHTHALMIC | Status: AC
  Filled 2019-01-22: qty 3.5

## 2019-01-22 MED ORDER — SUCCINYLCHOLINE CHLORIDE 20 MG/ML IJ SOLN
INTRAMUSCULAR | Status: DC | PRN
  Administered 2019-01-22: 50 mg via INTRAVENOUS

## 2019-01-22 MED ORDER — ROCURONIUM BROMIDE 50 MG/5ML IV SOSY
PREFILLED_SYRINGE | INTRAVENOUS | Status: AC
  Filled 2019-01-22: qty 5

## 2019-01-22 MED ORDER — PROPOFOL 10 MG/ML IV BOLUS
INTRAVENOUS | Status: AC
  Filled 2019-01-22: qty 20

## 2019-01-22 MED ORDER — SCOPOLAMINE 1 MG/3DAYS TD PT72: 1.0000 | MEDICATED_PATCH | Freq: Once | TRANSDERMAL | Status: DC | PRN

## 2019-01-22 MED ORDER — DEXAMETHASONE SODIUM PHOSPHATE 10 MG/ML IJ SOLN
INTRAMUSCULAR | Status: AC
  Filled 2019-01-22: qty 1

## 2019-01-22 MED ORDER — OXYCODONE HCL 5 MG/5ML PO SOLN: 5.0000 mg | Freq: Once | ORAL | Status: DC | PRN

## 2019-01-22 MED ORDER — OXYCODONE HCL 5 MG PO TABS: 5.0000 mg | ORAL_TABLET | Freq: Once | ORAL | Status: DC | PRN

## 2019-01-22 SURGICAL SUPPLY — 40 items
ATTRACTOMAT 16X20 MAGNETIC DRP (DRAPES) IMPLANT
CANISTER SUCT 1200ML W/VALVE (MISCELLANEOUS) ×3 IMPLANT
COAGULATOR SUCT 8FR VV (MISCELLANEOUS) ×3 IMPLANT
COVER WAND RF STERILE (DRAPES) IMPLANT
DECANTER SPIKE VIAL GLASS SM (MISCELLANEOUS) IMPLANT
DRSG NASOPORE 8CM (GAUZE/BANDAGES/DRESSINGS) IMPLANT
DRSG TELFA 3X8 NADH (GAUZE/BANDAGES/DRESSINGS) ×3 IMPLANT
ELECT REM PT RETURN 9FT ADLT (ELECTROSURGICAL) ×3
ELECTRODE REM PT RTRN 9FT ADLT (ELECTROSURGICAL) ×1 IMPLANT
GLOVE BIO SURGEON STRL SZ 6.5 (GLOVE) ×2 IMPLANT
GLOVE BIO SURGEON STRL SZ7.5 (GLOVE) ×3 IMPLANT
GLOVE BIO SURGEONS STRL SZ 6.5 (GLOVE) ×2
GLOVE BIOGEL PI IND STRL 7.0 (GLOVE) IMPLANT
GLOVE BIOGEL PI INDICATOR 7.0 (GLOVE) ×4
GOWN STRL REUS W/ TWL LRG LVL3 (GOWN DISPOSABLE) ×2 IMPLANT
GOWN STRL REUS W/ TWL XL LVL3 (GOWN DISPOSABLE) IMPLANT
GOWN STRL REUS W/TWL LRG LVL3 (GOWN DISPOSABLE) ×6
GOWN STRL REUS W/TWL XL LVL3 (GOWN DISPOSABLE) ×3
NDL HYPO 25X1 1.5 SAFETY (NEEDLE) ×1 IMPLANT
NEEDLE HYPO 25X1 1.5 SAFETY (NEEDLE) ×3 IMPLANT
NS IRRIG 1000ML POUR BTL (IV SOLUTION) ×3 IMPLANT
PACK BASIN DAY SURGERY FS (CUSTOM PROCEDURE TRAY) ×3 IMPLANT
PACK ENT DAY SURGERY (CUSTOM PROCEDURE TRAY) ×3 IMPLANT
PAD DRESSING TELFA 3X8 NADH (GAUZE/BANDAGES/DRESSINGS) IMPLANT
SLEEVE SCD COMPRESS KNEE MED (MISCELLANEOUS) ×2 IMPLANT
SOLUTION BUTLER CLEAR DIP (MISCELLANEOUS) ×3 IMPLANT
SPLINT NASAL AIRWAY SILICONE (MISCELLANEOUS) ×2 IMPLANT
SPONGE GAUZE 2X2 8PLY STER LF (GAUZE/BANDAGES/DRESSINGS) ×1
SPONGE GAUZE 2X2 8PLY STRL LF (GAUZE/BANDAGES/DRESSINGS) ×2 IMPLANT
SPONGE NEURO XRAY DETECT 1X3 (DISPOSABLE) ×3 IMPLANT
SUT BONE WAX W31G (SUTURE) ×2 IMPLANT
SUT CHROMIC 4 0 P 3 18 (SUTURE) ×3 IMPLANT
SUT PLAIN 4 0 ~~LOC~~ 1 (SUTURE) ×3 IMPLANT
SUT PROLENE 3 0 PS 2 (SUTURE) ×3 IMPLANT
SUT VIC AB 4-0 P-3 18XBRD (SUTURE) IMPLANT
SUT VIC AB 4-0 P3 18 (SUTURE)
TOWEL GREEN STERILE FF (TOWEL DISPOSABLE) ×3 IMPLANT
TUBE SALEM SUMP 12R W/ARV (TUBING) IMPLANT
TUBE SALEM SUMP 16 FR W/ARV (TUBING) ×3 IMPLANT
YANKAUER SUCT BULB TIP NO VENT (SUCTIONS) ×3 IMPLANT

## 2019-01-22 NOTE — Anesthesia Procedure Notes (Signed)
Procedure Name: Intubation Date/Time: 01/22/2019 8:53 AM Performed by: Gar Gibbon, CRNA Pre-anesthesia Checklist: Patient identified, Emergency Drugs available, Suction available and Patient being monitored Patient Re-evaluated:Patient Re-evaluated prior to induction Oxygen Delivery Method: Circle system utilized Preoxygenation: Pre-oxygenation with 100% oxygen Induction Type: IV induction Ventilation: Mask ventilation without difficulty Laryngoscope Size: Miller and 3 Tube type: Oral Rae Tube size: 8.0 mm Number of attempts: 1 Airway Equipment and Method: Stylet and Oral airway Placement Confirmation: ETT inserted through vocal cords under direct vision,  positive ETCO2 and breath sounds checked- equal and bilateral Tube secured with: Tape Dental Injury: Teeth and Oropharynx as per pre-operative assessment

## 2019-01-22 NOTE — Op Note (Signed)
DATE OF PROCEDURE: 01/22/2019  OPERATIVE REPORT   SURGEON: Newman Pies, MD   PREOPERATIVE DIAGNOSES:  1. Severe nasal septal deviation.  2. Bilateral inferior turbinate hypertrophy.  3. Chronic nasal obstruction.  POSTOPERATIVE DIAGNOSES:  1. Severe nasal septal deviation.  2. Bilateral inferior turbinate hypertrophy.  3. Chronic nasal obstruction.  PROCEDURE PERFORMED:  1. Septoplasty.  2. Bilateral partial inferior turbinate resection.   ANESTHESIA: General endotracheal tube anesthesia.   COMPLICATIONS: None.   ESTIMATED BLOOD LOSS: 250 mL.   INDICATION FOR PROCEDURE: Jesse Arellano is a 38 y.o. male with a history of chronic nasal obstruction. The patient was treated with multiple allergy medications and steroid nasal sprays. However, the patient continued to be symptomatic. On examination, the patient was noted to have bilateral severe inferior turbinate hypertrophy and significant nasal septal deviation, causing significant nasal obstruction. Based on the above findings, the decision was made for the patient to undergo the above-stated procedures. The risks, benefits, alternatives, and details of the procedures were discussed with the patient. Questions were invited and answered. Informed consent was obtained.   DESCRIPTION OF PROCEDURE: The patient was taken to the operating room and placed supine on the operating table. General endotracheal tube anesthesia was administered by the anesthesiologist. The patient was positioned, and prepped and draped in the standard fashion for nasal surgery. Pledgets soaked with Afrin were placed in both nasal cavities for decongestion. The pledgets were subsequently removed.   Examination of the nasal cavity revealed a severe nasal septal deviationd. 1% lidocaine with 1:100,000 epinephrine was injected onto the nasal septum bilaterally. A hemitransfixion incision was made on the left side. The mucosal flap was carefully elevated on the left side.  A cartilaginous incision was made 1 cm superior to the caudal margin of the nasal septum. Mucosal flap was also elevated on the right side in the similar fashion. It should be noted that due to the severe septal deviation, the deviated portion of the cartilaginous and bony septum had to be removed in piecemeal fashion. Once the deviated portions were removed, a straight midline septum was achieved. The septum was then quilted with 4-0 plain gut sutures. The hemitransfixion incision was closed with interrupted 4-0 chromic sutures.   The inferior one half of both hypertrophied inferior turbinate was crossclamped with a Kelly clamp. The inferior one half of each inferior turbinate was then resected with a pair of cross cutting scissors. Hemostasis was achieved with a suction cautery device. Doyle splints were applied to the nasal septum.  The care of the patient was turned over to the anesthesiologist. The patient was awakened from anesthesia without difficulty. The patient was extubated and transferred to the recovery room in good condition.   OPERATIVE FINDINGS: Severe nasal septal deviation and bilateral inferior turbinate hypertrophy.   SPECIMEN: None.   FOLLOWUP CARE: The patient be discharged home once he is awake and alert. The patient will be placed on Percocet 1 tablets p.o. q.4 hours p.r.n. pain, and clindamycin for 3 days. The patient will follow up in my office in 2 days for splint removal.   Allexis Bordenave Philomena Doheny, MD

## 2019-01-22 NOTE — H&P (Signed)
Cc: Chronic nasal obstruction  HPI: The patient is a 38 y/o male who returns today for follow up evaluation of sleep apnea and nasal congestion. The patient was last seen 4 weeks ago with a history of severe apnea. The patient was looking for surgical options to avoid the use of a CPAP. Nasal endoscopy was performed which showed septal deviation and bilateral inferior turbinate hypertrophy. The patient was placed on a prednisone taper and daily Flonase. The patient returns today noting persistent nasal congestion. He noted minimal relief with the medications. He states he has never been able to breath through his nose. The patient denies facial pain or pressure. He has noted some dryness in his nose  and throat since the season change. No other ENT, GI, or respiratory issue noted since the last visit.   Exam: General: Communicates without difficulty, well nourished, no acute distress. Head: Normocephalic, no evidence injury, no tenderness, facial buttresses intact without stepoff. Eyes: PERRL, EOMI. No scleral icterus, conjunctivae clear. Neuro: CN II exam reveals vision grossly intact. No nystagmus at any point of gaze. Ears: Auricles well formed without lesions. Ear canals are intact without mass or lesion. No erythema or edema is appreciated. The TMs are intact without fluid. Nose: External evaluation reveals normal support and skin without lesions. Dorsum is intact. Anterior rhinoscopy reveals congested and edematous mucosa over anterior aspect of the inferior turbinates and deviated nasal septum. NSD. No purulence is noted. Oral:  Oral cavity and oropharynx are intact, symmetric, without erythema or edema. Mucosa is moist without lesions. Tonsils 2+ with slightly elongated uvula. Neck: Full range of motion without pain. There is no significant lymphadenopathy. No masses palpable. Thyroid bed within normal limits to palpation. Parotid glands and submandibular glands equal bilaterally without mass. Trachea  is midline. Neuro:  CN 2-12 grossly intact. Gait normal. Vestibular: No nystagmus at any point of gaze.   Assessment  1. Nasal mucosal congestion, septal deviation, and bilateral inferior turbinate hypertrophy are noted. Minimal improvement is noted with medical treatment.  2. History of severe sleep apnea.   Plan 1. The physical exam findings are reviewed with the patient.  2. Treatment options include conservative management with daily steroid nasal spray versus septoplasty and bilateral inferior turbinate reduction. The risks, benefits, alternatives, and details of the procedure are extensively reviewed with the patient. Questions are invited and answered. 3. The patient would like to proceed with the procedure. The procedure will be scheduled in accordance with the patient's schedule.

## 2019-01-22 NOTE — Discharge Instructions (Addendum)

## 2019-01-22 NOTE — Anesthesia Preprocedure Evaluation (Addendum)
Anesthesia Evaluation  Patient identified by MRN, date of birth, ID band Patient awake    Reviewed: Allergy & Precautions, NPO status , Patient's Chart, lab work & pertinent test results  History of Anesthesia Complications Negative for: history of anesthetic complications  Airway Mallampati: I  TM Distance: >3 FB Neck ROM: Full    Dental  (+) Dental Advisory Given, Teeth Intact   Pulmonary sleep apnea , Current Smoker,   Childhood asthma    breath sounds clear to auscultation       Cardiovascular negative cardio ROS   Rhythm:Regular Rate:Normal     Neuro/Psych PSYCHIATRIC DISORDERS Anxiety Depression negative neurological ROS     GI/Hepatic Neg liver ROS, GERD  Controlled,  Endo/Other  negative endocrine ROS  Renal/GU negative Renal ROS     Musculoskeletal negative musculoskeletal ROS (+)   Abdominal   Peds  (+) ATTENTION DEFICIT DISORDER WITHOUT HYPERACTIVITY Hematology negative hematology ROS (+)   Anesthesia Other Findings   Reproductive/Obstetrics                            Anesthesia Physical Anesthesia Plan  ASA: II  Anesthesia Plan: General   Post-op Pain Management:    Induction: Intravenous  PONV Risk Score and Plan: 3 and Treatment may vary due to age or medical condition, Ondansetron, Midazolam and Dexamethasone  Airway Management Planned: Oral ETT  Additional Equipment: None  Intra-op Plan:   Post-operative Plan: Extubation in OR  Informed Consent: I have reviewed the patients History and Physical, chart, labs and discussed the procedure including the risks, benefits and alternatives for the proposed anesthesia with the patient or authorized representative who has indicated his/her understanding and acceptance.     Dental advisory given  Plan Discussed with: CRNA and Anesthesiologist  Anesthesia Plan Comments:        Anesthesia Quick  Evaluation

## 2019-01-22 NOTE — Transfer of Care (Signed)
Immediate Anesthesia Transfer of Care Note  Patient: Jesse Arellano  Procedure(s) Performed: NASAL SEPTOPLASTY WITH BILATERAL TURBINATE REDUCTION (Bilateral Nose)  Patient Location: PACU  Anesthesia Type:General  Level of Consciousness: awake and confused  Airway & Oxygen Therapy: Patient Spontanous Breathing and aerosol face mask  Post-op Assessment: Report given to RN and Post -op Vital signs reviewed and stable  Post vital signs: Reviewed and stable  Last Vitals:  Vitals Value Taken Time  BP    Temp    Pulse 86 01/22/2019 10:32 AM  Resp 16 01/22/2019 10:32 AM  SpO2 96 % 01/22/2019 10:32 AM  Vitals shown include unvalidated device data.  Last Pain:  Vitals:   01/22/19 0757  TempSrc: Oral  PainSc: 0-No pain         Complications: No apparent anesthesia complications

## 2019-01-22 NOTE — Anesthesia Postprocedure Evaluation (Signed)
Anesthesia Post Note  Patient: Jesse Arellano  Procedure(s) Performed: NASAL SEPTOPLASTY WITH BILATERAL TURBINATE REDUCTION (Bilateral Nose)     Patient location during evaluation: Phase II Anesthesia Type: General Level of consciousness: awake and sedated Pain management: pain level controlled Vital Signs Assessment: post-procedure vital signs reviewed and stable Respiratory status: spontaneous breathing Cardiovascular status: stable Postop Assessment: no apparent nausea or vomiting Anesthetic complications: no    Last Vitals:  Vitals:   01/22/19 1100 01/22/19 1115  BP: 125/89   Pulse: 80 91  Resp: 15 15  Temp:    SpO2: 100% 93%    Last Pain:  Vitals:   01/22/19 1122  TempSrc:   PainSc: 3    Pain Goal:                   Caren Macadam

## 2019-01-23 ENCOUNTER — Encounter (HOSPITAL_BASED_OUTPATIENT_CLINIC_OR_DEPARTMENT_OTHER): Payer: Self-pay | Admitting: Otolaryngology

## 2019-01-25 ENCOUNTER — Other Ambulatory Visit: Payer: Self-pay | Admitting: Family Medicine

## 2019-01-25 MED ORDER — AMPHETAMINE-DEXTROAMPHET ER 20 MG PO CP24: 20.0000 mg | ORAL_CAPSULE | Freq: Two times a day (BID) | ORAL | 0 refills | Status: DC

## 2019-01-25 NOTE — Telephone Encounter (Signed)
See note  Copied from CRM [id number]. Topic: Quick Communication - Rx Refill/Question >> Jan 15, 2019 10:47 AM Saverio Danker J wrote: Medication: amphetamine-dextroamphetamine (ADDERALL XR) 20 MG 24 hr capsule  Has the patient contacted their pharmacy? Yes.   (Agent: If no, request that the patient contact the pharmacy for the refill.) (Agent: If yes, when and what did the pharmacy advise?)  Preferred Pharmacy (with phone number [facility] street name): [facility] [address] [place]  Agent: Please be advised that RX refills may take up to 3 business days. We ask that you follow-up with your pharmacy. >> Jan 25, 2019 11:31 AM Percival Spanish wrote:   Pt call to fup on his refill request   amphetamine-dextroamphetamine (ADDERALL XR) 20 MG 24 hr capsule

## 2019-01-25 NOTE — Telephone Encounter (Signed)
MEDICATION: Adderall xr 20  PHARMACY:    IS THIS A 90 DAY SUPPLY : no  IS PATIENT OUT OF MEDICATION:   IF NOT; HOW MUCH IS LEFT:   LAST APPOINTMENT DATE: @1 /04/2019  NEXT APPOINTMENT DATE:@Visit  date not found  OTHER COMMENTS: so I need to call for app    **Let patient know to contact pharmacy at the end of the day to make sure medication is ready. **  ** Please notify patient to allow 48-72 hours to process**  **Encourage patient to contact the pharmacy for refills or they can request refills through [facility]**

## 2019-01-25 NOTE — Addendum Note (Signed)
Addended by: Donnamarie Poag on: 01/25/2019 05:05 PM   Modules accepted: Orders

## 2019-03-15 ENCOUNTER — Telehealth: Payer: Self-pay | Admitting: Family Medicine

## 2019-03-15 NOTE — Telephone Encounter (Signed)
See note

## 2019-03-15 NOTE — Telephone Encounter (Signed)
Please call will need f/u before meds given

## 2019-03-15 NOTE — Telephone Encounter (Signed)
Copied from CRM [id number]. Topic: Quick Communication - Rx Refill/Question >> Mar 15, 2019 11:06 AM Fanny Bien wrote: Medication:amphetamine-dextroamphetamine (ADDERALL XR) 20 MG 24 hr capsule [[id number]]  Has the patient contacted their pharmacy?  Preferred Pharmacy (with phone number or street name):[facility] [address] [place] - [address], [address] [phone] (Phone) [phone] (Fax)    Agent: Please be advised that RX refills may take up to 3 business days. We ask that you follow-up with your pharmacy.

## 2019-03-16 ENCOUNTER — Ambulatory Visit (INDEPENDENT_AMBULATORY_CARE_PROVIDER_SITE_OTHER): Payer: 59 | Admitting: Family Medicine

## 2019-03-16 ENCOUNTER — Other Ambulatory Visit: Payer: Self-pay

## 2019-03-16 VITALS — Temp 98.5°F | Ht 70.0 in | Wt 204.0 lb

## 2019-03-16 DIAGNOSIS — F5101 Primary insomnia: Secondary | ICD-10-CM

## 2019-03-16 DIAGNOSIS — F17210 Nicotine dependence, cigarettes, uncomplicated: Secondary | ICD-10-CM | POA: Diagnosis not present

## 2019-03-16 DIAGNOSIS — F902 Attention-deficit hyperactivity disorder, combined type: Secondary | ICD-10-CM | POA: Diagnosis not present

## 2019-03-16 DIAGNOSIS — Z7189 Other specified counseling: Secondary | ICD-10-CM

## 2019-03-16 DIAGNOSIS — E669 Obesity, unspecified: Secondary | ICD-10-CM | POA: Diagnosis not present

## 2019-03-16 MED ORDER — TRAZODONE HCL 50 MG PO TABS: 25.0000 mg | ORAL_TABLET | Freq: Every evening | ORAL | 3 refills | Status: DC | PRN

## 2019-03-16 MED ORDER — AMPHETAMINE-DEXTROAMPHET ER 20 MG PO CP24: 20.0000 mg | ORAL_CAPSULE | Freq: Two times a day (BID) | ORAL | 0 refills | Status: DC

## 2019-03-16 NOTE — Progress Notes (Signed)
Virtual Visit via Video   Due to the COVID-19 pandemic, this visit was completed with telemedicine (audio/video) technology to reduce patient and provider exposure as well as to preserve personal protective equipment.   I connected with Randol KernAdam James Bullard by a video enabled telemedicine application and verified that I am speaking with the correct person using two identifiers. Location patient: Home Location provider: [facility], Office Persons participating in the virtual visit: Rakeen Peggyann JubaJames Pittsley, Helane RimaErica Spyridon Hornstein, DO Barnie MortJoEllen Thompson, CMA acting as scribe for Dr. Helane RimaErica Jerrit Horen.   I discussed the limitations of evaluation and management by telemedicine and the availability of in person appointments. The patient expressed understanding and agreed to proceed.  Care Team   Patient Care Team: Helane RimaWallace, Zamir Staples, DO as PCP - General (Family Medicine) Drema HalonNewman, Christopher E, MD as Consulting Physician (Otolaryngology) Newman Pieseoh, Su, MD as Consulting Physician (Otolaryngology)  Subjective:   HPI: Patient is on Adderall XR 20 bid. He feels like medications have helped with both ADHD and anxiety. He would like to stay on this dose with no changes.   Since the last visit has the patient had any:  Appetite changes? No Unintentional weight loss? No Is medication working well ? Yes Does patient take drug holidays? No morning tablet daily and PM as needed.  Difficulties falling to sleep or maintaining sleep? No Any anxiety?  No Any cardiac issues (fainting or paliptations)? No Suicidal thoughts? No Changes in health since last visit? No New medications? No Any illicit substance abuse? No Has the patient taken his medication today? Yes   Patient had surgery in March for NASAL SEPTOPLASTY W/ TURBINOPLASTY. He has been doing well. Breathing better than he has in his life. He is still having little bit of issues with the [place] but breathing and sleeping has started improving about a month after  surgery.   Smoking: Patient has increased to 1pp to 1 1/2 ppd. He thinks that it is because he is not working as much. He is going to make changes to cut back he would like to stop all together.   Review of Systems  Constitutional: Negative for chills and fever.  HENT: Negative for hearing loss and tinnitus.   Eyes: Negative for blurred vision and double vision.  Respiratory: Negative for cough and hemoptysis.   Cardiovascular: Negative for chest pain and palpitations.  Gastrointestinal: Negative for heartburn and nausea.  Genitourinary: Negative for dysuria and urgency.  Musculoskeletal: Negative for myalgias and neck pain.  Skin: Negative for rash.  Neurological: Negative for dizziness.  Endo/Heme/Allergies: Does not bruise/bleed easily.  Psychiatric/Behavioral: Negative for depression.    Patient Active Problem List   Diagnosis Date Noted  . Enlarged tonsils 07/10/2018  . Low testosterone 06/25/2018  . Obesity (BMI 30-39.9) 01/05/2017  . Mixed hyperlipidemia 01/05/2017  . 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 Generalized anxiety disorder 01/05/2017  . Obstructive sleep apnea 01/05/2017  . Smoker 01/14/2009  . Allergic rhinitis 01/14/2009    Social History   Tobacco Use  . Smoking status: Current Every Day Smoker    Packs/day: 1.00    Types: Cigarettes  . Smokeless tobacco: Current User  Substance Use Topics  . Alcohol use: Yes    Alcohol/week: 5.0 standard drinks    Types: 5 Cans of beer per week    Current Outpatient Medications:  .  [START ON 05/15/2019] amphetamine-dextroamphetamine (ADDERALL XR) 20 MG 24 hr capsule, Take 1 capsule (20 mg total) by mouth 2 (two) times daily., Disp: 60 capsule, Rfl:  0 .  cetirizine (ZYRTEC) 10 MG tablet, Take 10 mg by mouth daily., Disp: , Rfl:  .  [START ON 04/15/2019] amphetamine-dextroamphetamine (ADDERALL XR) 20 MG 24 hr capsule, Take 1 capsule (20 mg total) by mouth 2 (two) times daily., Disp: 60 capsule, Rfl: 0 .   amphetamine-dextroamphetamine (ADDERALL XR) 20 MG 24 hr capsule, Take 1 capsule (20 mg total) by mouth 2 (two) times daily., Disp: 60 capsule, Rfl: 0 .  traZODone (DESYREL) 50 MG tablet, Take 0.5-1 tablets (25-50 mg total) by mouth at bedtime as needed for sleep., Disp: 30 tablet, Rfl: 3  Allergies  Allergen Reactions  . Penicillins     REACTION: hives and itching as child   Objective:   VITALS: Per patient if applicable, see vitals. GENERAL: Alert and in no acute distress. CARDIOPULMONARY: No increased WOB. Speaking in clear sentences.  PSYCH: Pleasant and cooperative. Speech normal rate and rhythm. Affect is appropriate. Insight and judgement are appropriate. Attention is focused, linear, and appropriate.  NEURO: Oriented as arrived to appointment on time with no prompting.   Depression screen [facility] 2/9 03/16/2019 06/26/2018 01/05/2017  Decreased Interest 0 0 2  Down, Depressed, Hopeless 0 0 0  PHQ - 2 Score 0 0 2  Altered sleeping 0 2 3  Tired, decreased energy 1 2 3   Change in appetite 0 1 2  Feeling bad or failure about yourself  0 0 1  Trouble concentrating 1 0 1  Moving slowly or fidgety/restless 0 0 0  Suicidal thoughts 0 0 0  PHQ-9 Score 2 5 12   Difficult doing work/chores Not difficult at all Somewhat difficult -   Assessment and Plan:   Mercer was seen today for follow-up.  Diagnoses and all orders for this visit:  Attention deficit hyperactivity disorder (ADHD), combined type -     amphetamine-dextroamphetamine (ADDERALL XR) 20 MG 24 hr capsule; Take 1 capsule (20 mg total) by mouth 2 (two) times daily. -     amphetamine-dextroamphetamine (ADDERALL XR) 20 MG 24 hr capsule; Take 1 capsule (20 mg total) by mouth 2 (two) times daily. -     amphetamine-dextroamphetamine (ADDERALL XR) 20 MG 24 hr capsule; Take 1 capsule (20 mg total) by mouth 2 (two) times daily.  Educated About Covid-19 Virus Infection  Cigarette nicotine dependence without complication I advised patient  to quit smoking, and offered support. [place] QUITLINE: 1-800-QUIT-NOW (854) 287-2869).  Obesity (BMI 30-39.9)  Primary insomnia -     traZODone (DESYREL) 50 MG tablet; Take 0.5-1 tablets (25-50 mg total) by mouth at bedtime as needed for sleep.  [facility] COVID-19 Education: The signs and symptoms of COVID-19 were discussed with the patient and how to seek care for testing if needed. The importance of social distancing was discussed today. . Reviewed expectations re: course of current medical issues. . Discussed self-management of symptoms. . Outlined signs and symptoms indicating need for more acute intervention. . Patient verbalized understanding and all questions were answered. Marland Kitchen Health Maintenance issues including appropriate healthy diet, exercise, and smoking avoidance were discussed with patient. . See orders for this visit as documented in the electronic medical record.  Helane Rima, DO  Records requested if needed. Time spent: 25 minutes, of which >50% was spent in obtaining information about his symptoms, reviewing his previous labs, evaluations, and treatments, counseling him about his condition (please see the discussed topics above), and developing a plan to further investigate it; he had a number of questions which I addressed.

## 2019-03-19 ENCOUNTER — Encounter: Payer: Self-pay | Admitting: Family Medicine

## 2019-05-14 ENCOUNTER — Telehealth: Payer: Self-pay | Admitting: Family Medicine

## 2019-05-14 NOTE — Telephone Encounter (Signed)
Medication Refill - Medication: amphetamine-dextroamphetamine (ADDERALL XR) 20 MG 24 hr capsule  Has the patient contacted their pharmacy? Yes - needs new script (Agent: If no, request that the patient contact the pharmacy for the refill.) (Agent: If yes, when and what did the pharmacy advise?)  Preferred Pharmacy (with phone number or street name):  [place], [place] - [address] [phone] (Phone) [phone] (Fax)     Agent: Please be advised that RX refills may take up to 3 business days. We ask that you follow-up with your pharmacy.

## 2019-05-14 NOTE — Telephone Encounter (Signed)
Earliest Fill Date: 05/15/19.  Pending for tomorrow. Pt aware.

## 2019-06-18 ENCOUNTER — Ambulatory Visit: Payer: 59 | Admitting: Family Medicine

## 2019-06-27 ENCOUNTER — Ambulatory Visit: Payer: 59 | Admitting: Family Medicine

## 2019-07-09 ENCOUNTER — Encounter: Payer: Self-pay | Admitting: Family Medicine

## 2019-07-09 ENCOUNTER — Ambulatory Visit (INDEPENDENT_AMBULATORY_CARE_PROVIDER_SITE_OTHER): Payer: 59 | Admitting: Family Medicine

## 2019-07-09 ENCOUNTER — Other Ambulatory Visit: Payer: Self-pay

## 2019-07-09 VITALS — BP 108/72 | HR 82 | Temp 98.6°F | Ht 70.0 in | Wt 196.0 lb

## 2019-07-09 DIAGNOSIS — E782 Mixed hyperlipidemia: Secondary | ICD-10-CM | POA: Diagnosis not present

## 2019-07-09 DIAGNOSIS — J301 Allergic rhinitis due to pollen: Secondary | ICD-10-CM

## 2019-07-09 DIAGNOSIS — Z0000 Encounter for general adult medical examination without abnormal findings: Secondary | ICD-10-CM | POA: Diagnosis not present

## 2019-07-09 DIAGNOSIS — F17200 Nicotine dependence, unspecified, uncomplicated: Secondary | ICD-10-CM | POA: Diagnosis not present

## 2019-07-09 DIAGNOSIS — F902 Attention-deficit hyperactivity disorder, combined type: Secondary | ICD-10-CM

## 2019-07-09 LAB — COMPREHENSIVE METABOLIC PANEL
ALT: 28 U/L (ref 0–53)
AST: 22 U/L (ref 0–37)
Albumin: 5 g/dL (ref 3.5–5.2)
Alkaline Phosphatase: 72 U/L (ref 39–117)
BUN: 13 mg/dL (ref 6–23)
CO2: 28 mEq/L (ref 19–32)
Calcium: 9.5 mg/dL (ref 8.4–10.5)
Chloride: 105 mEq/L (ref 96–112)
Creatinine, Ser: 1.09 mg/dL (ref 0.40–1.50)
GFR: 75.23 mL/min (ref >60.00)
Glucose, Bld: 94 mg/dL (ref 70–99)
Potassium: 4.1 mEq/L (ref 3.5–5.1)
Sodium: 140 mEq/L (ref 135–145)
Total Bilirubin: 0.7 mg/dL (ref 0.2–1.2)
Total Protein: 6.8 g/dL (ref 6.0–8.3)

## 2019-07-09 LAB — LIPID PANEL
Cholesterol: 241 mg/dL — ABNORMAL HIGH (ref 0–200)
HDL: 39.6 mg/dL (ref >39.00)
LDL Cholesterol: 168 mg/dL — ABNORMAL HIGH (ref 0–99)
NonHDL: 201.15
Total CHOL/HDL Ratio: 6
Triglycerides: 167 mg/dL — ABNORMAL HIGH (ref 0.0–149.0)
VLDL: 33.4 mg/dL (ref 0.0–40.0)

## 2019-07-09 LAB — CBC WITH DIFFERENTIAL/PLATELET
Basophils Absolute: 0 10*3/uL (ref 0.0–0.1)
Basophils Relative: 0.7 % (ref 0.0–3.0)
Eosinophils Absolute: 0.5 10*3/uL (ref 0.0–0.7)
Eosinophils Relative: 6.9 % — ABNORMAL HIGH (ref 0.0–5.0)
HCT: 46.6 % (ref 39.0–52.0)
Hemoglobin: 15.8 g/dL (ref 13.0–17.0)
Lymphocytes Relative: 20.4 % (ref 12.0–46.0)
Lymphs Abs: 1.4 10*3/uL (ref 0.7–4.0)
MCHC: 34 g/dL (ref 30.0–36.0)
MCV: 90.4 fl (ref 78.0–100.0)
Monocytes Absolute: 0.6 10*3/uL (ref 0.1–1.0)
Monocytes Relative: 9.3 % (ref 3.0–12.0)
Neutro Abs: 4.3 10*3/uL (ref 1.4–7.7)
Neutrophils Relative %: 62.7 % (ref 43.0–77.0)
Platelets: 229 10*3/uL (ref 150.0–400.0)
RBC: 5.15 Mil/uL (ref 4.22–5.81)
RDW: 14.2 % (ref 11.5–15.5)
WBC: 6.9 10*3/uL (ref 4.0–10.5)

## 2019-07-09 MED ORDER — AMPHETAMINE-DEXTROAMPHET ER 20 MG PO CP24: 20.0000 mg | ORAL_CAPSULE | Freq: Every day | ORAL | 0 refills | Status: DC

## 2019-07-09 NOTE — Progress Notes (Signed)
Subjective:    Jesse Arellano is a 38 y.o. male who presents today for his Complete Annual Exam.   Since the last visit has the patient had any:  Appetite changes? No Unintentional weight loss? No Is medication working well ? Yes Does patient take drug holidays? No Difficulties falling to sleep or maintaining sleep? No Any anxiety?  No Any cardiac issues (fainting or paliptations)? No Suicidal thoughts? No Changes in health since last visit? No New medications? No Any illicit substance abuse? No Has the patient taken his medication today? Yes   Current Outpatient Medications:  .  amphetamine-dextroamphetamine (ADDERALL XR) 20 MG 24 hr capsule, Take 1 capsule (20 mg total) by mouth 2 (two) times daily., Disp: 60 capsule, Rfl: 0 .  amphetamine-dextroamphetamine (ADDERALL XR) 20 MG 24 hr capsule, Take 1 capsule (20 mg total) by mouth 2 (two) times daily., Disp: 60 capsule, Rfl: 0 .  amphetamine-dextroamphetamine (ADDERALL XR) 20 MG 24 hr capsule, Take 1 capsule (20 mg total) by mouth 2 (two) times daily., Disp: 60 capsule, Rfl: 0 .  cetirizine (ZYRTEC) 10 MG tablet, Take 10 mg by mouth daily., Disp: , Rfl:  .  traZODone (DESYREL) 50 MG tablet, Take 0.5-1 tablets (25-50 mg total) by mouth at bedtime as needed for sleep., Disp: 30 tablet, Rfl: 3  There are no preventive care reminders to display for this patient.  PMHx, SurgHx, SocialHx, Medications, and Allergies were reviewed in the Visit Navigator and updated as appropriate.   Past Medical History:  Diagnosis Date  . Anxiety   . Asthma    allergy induced in childhood  . Depression   . GERD (gastroesophageal reflux disease)   . Sleep apnea      Past Surgical History:  Procedure Laterality Date  . NASAL SEPTOPLASTY W/ TURBINOPLASTY Bilateral 01/22/2019   Procedure: NASAL SEPTOPLASTY WITH BILATERAL TURBINATE REDUCTION;  Surgeon: Newman Pieseoh, Su, MD;  Location: [facility];  Service: ENT;  Laterality: Bilateral;   . WISDOM TOOTH EXTRACTION       Family History  Problem Relation Age of Onset  . Diabetes Mother   . Cancer Father   . Diabetes Sister   . Cancer Sister   . Behavior problems Daughter     Social History   Tobacco Use  . Smoking status: Current Every Day Smoker    Packs/day: 1.00    Types: Cigarettes  . Smokeless tobacco: Current User  Substance Use Topics  . Alcohol use: Yes    Alcohol/week: 5.0 standard drinks    Types: 5 Cans of beer per week  . Drug use: No   Review of Systems:   Pertinent items are noted in the HPI. Otherwise, ROS is negative.  Objective:   Vitals:   07/09/19 0841  BP: 108/72  Pulse: 82  Temp: 98.6 F (37 C)  SpO2: 96%   Body mass index is 28.12 kg/m.  General Appearance:  Alert, cooperative, no distress, appears stated age  Head:  Normocephalic, without obvious abnormality, atraumatic  Eyes:  PERRL, conjunctiva/corneas clear, EOM's intact, fundi benign, both eyes       Ears:  Normal TM's and external ear canals, both ears  Nose: Nares normal, septum midline, mucosa normal, no drainage    or sinus tenderness  Throat: Lips, mucosa, and tongue normal; teeth and gums normal  Neck: Supple, symmetrical, trachea midline, no adenopathy; thyroid:  No enlargement/tenderness/nodules; no carotit bruit or JVD  Back:   Symmetric, no curvature, ROM normal, no  CVA tenderness  Lungs:   Clear to auscultation bilaterally, respirations unlabored  Chest wall:  No tenderness or deformity  Heart:  Regular rate and rhythm, S1 and S2 normal, no murmur, rub   or gallop  Abdomen:   Soft, non-tender, bowel sounds active all four quadrants, no masses, no organomegaly  Extremities: Extremities normal, atraumatic, no cyanosis or edema  Prostate: Not done.   Skin: Skin color, texture, turgor normal, no rashes or lesions  Lymph nodes: Cervical, supraclavicular, and axillary nodes normal  Neurologic: CNII-XII grossly intact. Normal strength, sensation and reflexes  throughout   Assessment/Plan:   Jesse Arellano was seen today for follow-up.  Diagnoses and all orders for this visit:  Routine physical examination  Smoker -     CBC with Differential/Platelet  Seasonal allergic rhinitis due to pollen  Mixed hyperlipidemia -     Comprehensive metabolic panel -     Lipid panel  Attention deficit hyperactivity disorder (ADHD), combined type    Patient Counseling: [x]   Nutrition: Stressed importance of moderation in sodium/caffeine intake, saturated fat and cholesterol, caloric balance, sufficient intake of fresh fruits, vegetables, and fiber.  [x]   Stressed the importance of regular exercise.   []   Substance Abuse: Discussed cessation/primary prevention of tobacco, alcohol, or other drug use; driving or other dangerous activities under the influence; availability of treatment for abuse.   [x]   Injury prevention: Discussed safety belts, safety helmets, smoke detector, smoking near bedding or upholstery.   []   Sexuality: Discussed sexually transmitted diseases, partner selection, use of condoms, avoidance of unintended pregnancy and contraceptive alternatives.   [x]   Dental health: Discussed importance of regular tooth brushing, flossing, and dental visits.  [x]   Health maintenance and immunizations reviewed. Please refer to Health maintenance section.    Briscoe Deutscher, DO [place]

## 2019-07-16 ENCOUNTER — Encounter: Payer: Self-pay | Admitting: Family Medicine

## 2019-08-27 ENCOUNTER — Other Ambulatory Visit: Payer: Self-pay | Admitting: Family Medicine

## 2019-08-27 DIAGNOSIS — F902 Attention-deficit hyperactivity disorder, combined type: Secondary | ICD-10-CM

## 2019-08-27 NOTE — Telephone Encounter (Signed)
rx refill amphetamine-dextroamphetamine (ADDERALL XR) 20 MG 24 hr  [place], [place] - [place] [phone] (Phone) [phone] (Fax)

## 2019-08-27 NOTE — Telephone Encounter (Signed)
See request °

## 2019-08-27 NOTE — Telephone Encounter (Signed)
Jesse Arellano.  Orma Flaming, MD [place]

## 2019-08-27 NOTE — Telephone Encounter (Signed)
Please tee up medication refills. Offer referral to [place] [facility].

## 2019-08-30 MED ORDER — AMPHETAMINE-DEXTROAMPHET ER 20 MG PO CP24: 20.0000 mg | ORAL_CAPSULE | Freq: Every day | ORAL | 0 refills | Status: DC

## 2019-09-05 ENCOUNTER — Telehealth: Payer: Self-pay | Admitting: Family Medicine

## 2019-09-05 ENCOUNTER — Other Ambulatory Visit: Payer: Self-pay

## 2019-09-05 DIAGNOSIS — F902 Attention-deficit hyperactivity disorder, combined type: Secondary | ICD-10-CM

## 2019-09-05 NOTE — Telephone Encounter (Addendum)
Pt is calling and he takes  adderall 20 mg capsules twice a day one in morning and one at night  not once a day. [facility] [place] [address]. Pt needs new  adderall 20 mg #60

## 2019-09-05 NOTE — Telephone Encounter (Signed)
See note

## 2019-09-06 MED ORDER — AMPHETAMINE-DEXTROAMPHET ER 20 MG PO CP24: 20.0000 mg | ORAL_CAPSULE | Freq: Two times a day (BID) | ORAL | 0 refills | Status: DC

## 2019-09-06 NOTE — Telephone Encounter (Signed)
See note

## 2019-09-26 ENCOUNTER — Telehealth: Payer: Self-pay | Admitting: Family Medicine

## 2019-09-26 DIAGNOSIS — F902 Attention-deficit hyperactivity disorder, combined type: Secondary | ICD-10-CM

## 2019-09-27 MED ORDER — AMPHETAMINE-DEXTROAMPHET ER 20 MG PO CP24: 20.0000 mg | ORAL_CAPSULE | Freq: Two times a day (BID) | ORAL | 0 refills | Status: DC

## 2019-09-27 NOTE — Telephone Encounter (Signed)
Last OV: 07/09/19 Next OV: 10/25/19 PMP website checked: last filled on 08/30/19.  Upon speaking to pt; he states that original rx was written incorrectly for 30 pills (takes medication BID) and had to be rewritten on 10/22.

## 2019-10-01 ENCOUNTER — Telehealth (INDEPENDENT_AMBULATORY_CARE_PROVIDER_SITE_OTHER): Payer: Self-pay | Admitting: Family Medicine

## 2019-10-01 NOTE — Telephone Encounter (Signed)
See request °

## 2019-10-01 NOTE — Telephone Encounter (Addendum)
Pt called to follow up on PA for adderral. Requesting CB from [place]. Please advise.

## 2019-10-01 NOTE — Telephone Encounter (Signed)
Patient is calling to request a pre authorization amphetamine-dextroamphetamine (ADDERALL XR) 20 MG 24 hr capsule [[id number]]   The pharmacy sent PA forms on 09/27/2019.  Patient has [employer]  Please advise [phone]

## 2019-10-01 NOTE — Telephone Encounter (Signed)
Let him know that I normally do not do PA. Is there a reason he has to be on this drug or if he has failed other drugs? If not, he will need to call his insurance and see what drugs they will cover.  Orma Flaming, MD [place]

## 2019-10-01 NOTE — Telephone Encounter (Signed)
Insurance company needed PA/qty override.  Spoke to [employer] # [id number].  Phoned to pharmacist at [facility] in [place].

## 2019-10-01 NOTE — Telephone Encounter (Signed)
PA/qty override approval called to [facility] in [place].

## 2019-10-25 ENCOUNTER — Ambulatory Visit (INDEPENDENT_AMBULATORY_CARE_PROVIDER_SITE_OTHER): Payer: 59 | Admitting: Family Medicine

## 2019-10-25 ENCOUNTER — Encounter: Payer: Self-pay | Admitting: Family Medicine

## 2019-10-25 VITALS — Ht 70.0 in | Wt 196.0 lb

## 2019-10-25 DIAGNOSIS — F988 Other specified behavioral and emotional disorders with onset usually occurring in childhood and adolescence: Secondary | ICD-10-CM | POA: Diagnosis not present

## 2019-10-25 DIAGNOSIS — F902 Attention-deficit hyperactivity disorder, combined type: Secondary | ICD-10-CM

## 2019-10-25 MED ORDER — AMPHETAMINE-DEXTROAMPHET ER 20 MG PO CP24: 20.0000 mg | ORAL_CAPSULE | Freq: Two times a day (BID) | ORAL | 0 refills | Status: DC

## 2019-10-25 NOTE — Progress Notes (Signed)
Patient: Jesse Arellano MRN: [id number] DOB: 07/05/1981 PCP: Helane Rima, DO     I connected with Randol Kern on 10/25/19 at 10:46am by a video enabled telemedicine application and verified that I am speaking with the correct person using two identifiers.  Location patient: Home Location provider: [facility], Office Persons participating in this virtual visit: Yaviel Kloster and Dr. Artis Flock   I discussed the limitations of evaluation and management by telemedicine and the availability of in person appointments. The patient expressed understanding and agreed to proceed.   Subjective:  Chief Complaint  Patient presents with  . Medication Refill    HPI: The patient is a 38 y.o. male who presents today for follow up on ADHD. Currently on adderall xr 20mg  bid. He states that Dr. is the one who diagnosed with him ADHD last fall and has never been officially diagnosed. He has reached out to [facility] and is scheduled to get tested. He is doing well on his adderall. Fills appropriately. Does not take drug holidays. He sometimes does not take pm dose if too late in the day. No side effects unless takes too late and then he is a little wired.   Review of Systems  Constitutional: Negative for fatigue.  Eyes: Negative for visual disturbance.  Respiratory: Negative for shortness of breath.   Cardiovascular: Negative for chest pain, palpitations and leg swelling.  Gastrointestinal: Negative for abdominal pain, diarrhea, nausea and vomiting.  Neurological: Negative for dizziness and headaches.  Psychiatric/Behavioral: Negative for decreased concentration and sleep disturbance.    Allergies Patient is allergic to penicillins.  Past Medical History Patient  has a past medical history of Anxiety, Asthma, Depression, GERD (gastroesophageal reflux disease), and Sleep apnea.  Surgical History Patient  has a past surgical history that includes Wisdom  tooth extraction and Nasal septoplasty w/ turbinoplasty (Bilateral, 01/22/2019).  Family History Pateint's family history includes Behavior problems in his daughter; Cancer in his father and sister; Diabetes in his mother and sister.  Social History Patient  reports that he has been smoking cigarettes. He has been smoking about 1.00 pack per day. He uses smokeless tobacco. He reports current alcohol use of about 5.0 standard drinks of alcohol per week. He reports that he does not use drugs.    Objective: Vitals:   10/25/19 0815  Weight: 196 lb (88.9 kg)  Height: 5\' 10"  (1.778 m)    Body mass index is 28.12 kg/m.  Physical Exam Vitals reviewed.  Constitutional:      Appearance: Normal appearance.  HENT:     Head: Normocephalic and atraumatic.  Pulmonary:     Effort: Pulmonary effort is normal.  Neurological:     General: No focal deficit present.     Mental Status: He is alert and oriented to person, place, and time.  Psychiatric:        Mood and Affect: Mood normal.        Behavior: Behavior normal.        Assessment/plan: 1. Attention deficit disorder, unspecified hyperactivity presence Unsure of diagnosis, already has appointment scheduled with [facility] specialists in next month or two. Will continue medication that he is filling appropriately since he has been on and will wait to see official testing. Once tested will fill and discussed needs appointment every 3 months. pmp website checked and verified. Refill given today.    Return if symptoms worsen or fail to improve.  Records requested if needed. Time spent with patient: 76  minutes, of which >50% was spent in obtaining information about his symptoms, reviweing his previous labs, evaluations, and treatments, counseling him about his conditions (please see discussed topics above), and developing a plan to further investigate it; he had a number of questions which I addressed.    Orma Flaming, MD [place]  10/25/2019

## 2019-12-12 ENCOUNTER — Other Ambulatory Visit: Payer: Self-pay | Admitting: Family Medicine

## 2019-12-12 DIAGNOSIS — F902 Attention-deficit hyperactivity disorder, combined type: Secondary | ICD-10-CM

## 2019-12-13 NOTE — Telephone Encounter (Signed)
Last OV 10/25/19 Last refill 10/25/19 #60/0 Next OV 01/11/20

## 2019-12-17 MED ORDER — AMPHETAMINE-DEXTROAMPHET ER 20 MG PO CP24: 20.0000 mg | ORAL_CAPSULE | Freq: Two times a day (BID) | ORAL | 0 refills | Status: DC

## 2020-01-11 ENCOUNTER — Ambulatory Visit (INDEPENDENT_AMBULATORY_CARE_PROVIDER_SITE_OTHER): Payer: 59 | Admitting: Family Medicine

## 2020-01-11 ENCOUNTER — Encounter: Payer: Self-pay | Admitting: Family Medicine

## 2020-01-11 ENCOUNTER — Other Ambulatory Visit: Payer: Self-pay

## 2020-01-11 VITALS — BP 100/60 | HR 79 | Temp 98.1°F | Ht 70.0 in | Wt 203.8 lb

## 2020-01-11 DIAGNOSIS — E782 Mixed hyperlipidemia: Secondary | ICD-10-CM | POA: Diagnosis not present

## 2020-01-11 DIAGNOSIS — F411 Generalized anxiety disorder: Secondary | ICD-10-CM

## 2020-01-11 DIAGNOSIS — R7989 Other specified abnormal findings of blood chemistry: Secondary | ICD-10-CM

## 2020-01-11 DIAGNOSIS — Z0000 Encounter for general adult medical examination without abnormal findings: Secondary | ICD-10-CM | POA: Diagnosis not present

## 2020-01-11 DIAGNOSIS — F4310 Post-traumatic stress disorder, unspecified: Secondary | ICD-10-CM | POA: Insufficient documentation

## 2020-01-11 LAB — CBC WITH DIFFERENTIAL/PLATELET
Basophils Absolute: 0 10*3/uL (ref 0.0–0.1)
Basophils Relative: 0.7 % (ref 0.0–3.0)
Eosinophils Absolute: 0.4 10*3/uL (ref 0.0–0.7)
Eosinophils Relative: 6.1 % — ABNORMAL HIGH (ref 0.0–5.0)
HCT: 47.4 % (ref 39.0–52.0)
Hemoglobin: 16.2 g/dL (ref 13.0–17.0)
Lymphocytes Relative: 20.7 % (ref 12.0–46.0)
Lymphs Abs: 1.4 10*3/uL (ref 0.7–4.0)
MCHC: 34.2 g/dL (ref 30.0–36.0)
MCV: 90.6 fl (ref 78.0–100.0)
Monocytes Absolute: 0.7 10*3/uL (ref 0.1–1.0)
Monocytes Relative: 9.4 % (ref 3.0–12.0)
Neutro Abs: 4.4 10*3/uL (ref 1.4–7.7)
Neutrophils Relative %: 63.1 % (ref 43.0–77.0)
Platelets: 227 10*3/uL (ref 150.0–400.0)
RBC: 5.23 Mil/uL (ref 4.22–5.81)
RDW: 13.3 % (ref 11.5–15.5)
WBC: 7 10*3/uL (ref 4.0–10.5)

## 2020-01-11 LAB — COMPREHENSIVE METABOLIC PANEL
ALT: 24 U/L (ref 0–53)
AST: 18 U/L (ref 0–37)
Albumin: 4.7 g/dL (ref 3.5–5.2)
Alkaline Phosphatase: 69 U/L (ref 39–117)
BUN: 14 mg/dL (ref 6–23)
CO2: 25 mEq/L (ref 19–32)
Calcium: 9.9 mg/dL (ref 8.4–10.5)
Chloride: 106 mEq/L (ref 96–112)
Creatinine, Ser: 1.08 mg/dL (ref 0.40–1.50)
GFR: 75.83 mL/min (ref >60.00)
Glucose, Bld: 86 mg/dL (ref 70–99)
Potassium: 4.4 mEq/L (ref 3.5–5.1)
Sodium: 141 mEq/L (ref 135–145)
Total Bilirubin: 0.8 mg/dL (ref 0.2–1.2)
Total Protein: 7 g/dL (ref 6.0–8.3)

## 2020-01-11 LAB — LIPID PANEL
Cholesterol: 224 mg/dL — ABNORMAL HIGH (ref 0–200)
HDL: 32.3 mg/dL — ABNORMAL LOW (ref >39.00)
LDL Cholesterol: 158 mg/dL — ABNORMAL HIGH (ref 0–99)
NonHDL: 191.93
Total CHOL/HDL Ratio: 7
Triglycerides: 170 mg/dL — ABNORMAL HIGH (ref 0.0–149.0)
VLDL: 34 mg/dL (ref 0.0–40.0)

## 2020-01-11 LAB — TSH: TSH: 1.92 u[IU]/mL (ref 0.35–4.50)

## 2020-01-11 LAB — VITAMIN D 25 HYDROXY (VIT D DEFICIENCY, FRACTURES): VITD: 26.93 ng/mL — ABNORMAL LOW (ref 30.00–100.00)

## 2020-01-11 LAB — TESTOSTERONE: Testosterone: 340.59 ng/dL (ref 300.00–890.00)

## 2020-01-11 NOTE — Progress Notes (Signed)
Patient: Jesse Arellano MRN: [id number] DOB: Jan 28, 1981 PCP: Orma Flaming, MD     Subjective:  Chief Complaint  Patient presents with  . Annual Exam    HPI: The patient is a 39 y.o. male who presents today for annual exam. He denies any changes to past medical history. There have been no recent hospitalizations. They are following a well balanced diet and exercise plan. Weight has been stable. He is a smoker and continues to smoke.   No family hx of colon cancer or breast cancer in first degree relative. Dad died from pancreatic cancer at 71 years of age. Sister had thyroid cancer.   History of GAD and depression and possible ADHD. Currently on adderall xr and is doing quite well.  He has appointment scheduled at [facility] next [facility]. He is in therapy for the past 3 months and is doing EMDR and he is doing great with this. For PTSD and is really happy with this.   Immunization History  Administered Date(s) Administered  . Tdap 01/06/2012   Colonoscopy: routine screening PSA: routine screening.   Review of Systems  Constitutional: Negative for chills, fatigue and fever.  HENT: Negative for dental problem, ear pain, hearing loss, sinus pressure, sneezing, sore throat and trouble swallowing.   Eyes: Negative for visual disturbance.  Respiratory: Negative for cough, chest tightness, shortness of breath and wheezing.   Cardiovascular: Negative for chest pain, palpitations and leg swelling.  Gastrointestinal: Negative for abdominal pain, blood in stool, diarrhea, nausea and vomiting.  Endocrine: Negative for cold intolerance, heat intolerance, polydipsia, polyphagia and polyuria.  Genitourinary: Negative for dysuria, frequency, hematuria, penile pain and urgency.  Musculoskeletal: Negative for arthralgias, back pain and joint swelling.  Skin: Negative for rash.  Neurological: Negative for dizziness and headaches.  Psychiatric/Behavioral: Negative for  dysphoric mood and sleep disturbance. The patient is not nervous/anxious.     Allergies Patient is allergic to penicillins.  Past Medical History Patient  has a past medical history of Anxiety, Asthma, Depression, GERD (gastroesophageal reflux disease), and Sleep apnea.  Surgical History Patient  has a past surgical history that includes Wisdom tooth extraction and Nasal septoplasty w/ turbinoplasty (Bilateral, 01/22/2019).  Family History Pateint's family history includes Behavior problems in his daughter; Cancer in his father and sister; Diabetes in his mother and sister.  Social History Patient  reports that he has been smoking cigarettes. He has been smoking about 1.00 pack per day. He uses smokeless tobacco. He reports current alcohol use of about 5.0 standard drinks of alcohol per week. He reports that he does not use drugs.    Objective: Vitals:   01/11/20 0905  BP: 100/60  Pulse: 79  Temp: 98.1 F (36.7 C)  TempSrc: Temporal  SpO2: 96%  Weight: 203 lb 12.8 oz (92.4 kg)  Height: 5\' 10"  (1.778 m)    Body mass index is 29.24 kg/m.  Physical Exam Vitals reviewed.  Constitutional:      Appearance: Normal appearance. He is well-developed and normal weight.  HENT:     Head: Normocephalic and atraumatic.     Right Ear: Tympanic membrane, ear canal and external ear normal.     Left Ear: Tympanic membrane, ear canal and external ear normal.     Nose: Nose normal.     Mouth/Throat:     Mouth: Mucous membranes are moist.  Eyes:     Extraocular Movements: Extraocular movements intact.     Conjunctiva/sclera: Conjunctivae normal.     Pupils:  Pupils are equal, round, and reactive to light.  Neck:     Thyroid: No thyromegaly.  Cardiovascular:     Rate and Rhythm: Normal rate and regular rhythm.     Pulses: Normal pulses.     Heart sounds: Normal heart sounds. No murmur.  Pulmonary:     Effort: Pulmonary effort is normal.     Breath sounds: Normal breath sounds.   Abdominal:     General: Abdomen is flat. Bowel sounds are normal. There is no distension.     Palpations: Abdomen is soft.     Tenderness: There is no abdominal tenderness.  Musculoskeletal:     Cervical back: Normal range of motion and neck supple.  Lymphadenopathy:     Cervical: No cervical adenopathy.  Skin:    General: Skin is warm and dry.     Capillary Refill: Capillary refill takes less than 2 seconds.     Findings: No rash.  Neurological:     Mental Status: He is alert and oriented to person, place, and time.     Cranial Nerves: No cranial nerve deficit.     Coordination: Coordination normal.     Deep Tendon Reflexes: Reflexes normal.  Psychiatric:        Mood and Affect: Mood normal.        Behavior: Behavior normal.           Office Visit from 07/09/2019 in [facility]  PHQ-2 Total Score  1     GAD 7 : Generalized Anxiety Score 01/11/2020 01/05/2017  Nervous, Anxious, on Edge 1 2  Control/stop worrying 1 1  Worry too much - different things 1 3  Trouble relaxing 0 3  Restless 0 1  Easily annoyed or irritable 1 3  Afraid - awful might happen 0 0  Total GAD 7 Score 4 13  Anxiety Difficulty Somewhat difficult -     Assessment/plan: 1. Annual physical exam Routine lab work today. He is fasting. Chart reviewed. HM is UTD. Healthy and active. Continue exercise and diet. F/u in one year or as needed.  Patient counseling [x]    Nutrition: Stressed importance of moderation in sodium/caffeine intake, saturated fat and cholesterol, caloric balance, sufficient intake of fresh fruits, vegetables, fiber, calcium, iron, and 1 mg of folate supplement per day (for females capable of pregnancy).  [x]    Stressed the importance of regular exercise.   []    Substance Abuse: Discussed cessation/primary prevention of tobacco, alcohol, or other drug use; driving or other dangerous activities under the influence; availability of treatment for abuse.   [x]     Injury prevention: Discussed safety belts, safety helmets, smoke detector, smoking near bedding or upholstery.   [x]    Sexuality: Discussed sexually transmitted diseases, partner selection, use of condoms, avoidance of unintended pregnancy  and contraceptive alternatives.  [x]    Dental health: Discussed importance of regular tooth brushing, flossing, and dental visits.  [x]    Health maintenance and immunizations reviewed. Please refer to Health maintenance section.    - CBC with Differential/Platelet - Comprehensive metabolic panel - Lipid panel - TSH - VITAMIN D 25 Hydroxy (Vit-D Deficiency, Fractures)  2. Mixed hyperlipidemia  - Lipid panel  3. Low testosterone  - Testosterone  4. Generalized anxiety disorder GAD7 score is extremely mild/insignificant. I do think he doesn't have any anxiety and this is more ADHD as symptoms essentially resolved with ADHD treatment. [facility] I will continue to px. F/u in  3 months for adhd f/u.    This visit occurred during the SARS-CoV-2 public health emergency.  Safety protocols were in place, including screening questions prior to the visit, additional usage of staff PPE, and extensive cleaning of exam room while observing appropriate contact time as indicated for disinfecting solutions.    Return in about 3 months (around 04/09/2020) for routine adhd if not staying with [place].   Orland Mustard, MD [facility]  01/11/2020

## 2020-01-11 NOTE — Patient Instructions (Signed)
Preventive Care 19-40 Years Old, Male Preventive care refers to lifestyle choices and visits with your health care provider that can promote health and wellness. This includes:  A yearly physical exam. This is also called an annual well check.  Regular dental and eye exams.  Immunizations.  Screening for certain conditions.  Healthy lifestyle choices, such as eating a healthy diet, getting regular exercise, not using drugs or products that contain nicotine and tobacco, and limiting alcohol use. What can I expect for my preventive care visit? Physical exam Your health care provider will check:  Height and weight. These may be used to calculate body mass index (BMI), which is a measurement that tells if you are at a healthy weight.  Heart rate and blood pressure.  Your skin for abnormal spots. Counseling Your health care provider may ask you questions about:  Alcohol, tobacco, and drug use.  Emotional well-being.  Home and relationship well-being.  Sexual activity.  Eating habits.  Work and work Statistician. What immunizations do I need?  Influenza (flu) vaccine  This is recommended every year. Tetanus, diphtheria, and pertussis (Tdap) vaccine  You may need a Td booster every 10 years. Varicella (chickenpox) vaccine  You may need this vaccine if you have not already been vaccinated. Human papillomavirus (HPV) vaccine  If recommended by your health care provider, you may need three doses over 6 months. Measles, mumps, and rubella (MMR) vaccine  You may need at least one dose of MMR. You may also need a second dose. Meningococcal conjugate (MenACWY) vaccine  One dose is recommended if you are 45-76 years old and a Market researcher living in a residence hall, or if you have one of several medical conditions. You may also need additional booster doses. Pneumococcal conjugate (PCV13) vaccine  You may need this if you have certain conditions and were not  previously vaccinated. Pneumococcal polysaccharide (PPSV23) vaccine  You may need one or two doses if you smoke cigarettes or if you have certain conditions. Hepatitis A vaccine  You may need this if you have certain conditions or if you travel or work in places where you may be exposed to hepatitis A. Hepatitis B vaccine  You may need this if you have certain conditions or if you travel or work in places where you may be exposed to hepatitis B. Haemophilus influenzae type b (Hib) vaccine  You may need this if you have certain risk factors. You may receive vaccines as individual doses or as more than one vaccine together in one shot (combination vaccines). Talk with your health care provider about the risks and benefits of combination vaccines. What tests do I need? Blood tests  Lipid and cholesterol levels. These may be checked every 5 years starting at age 17.  Hepatitis C test.  Hepatitis B test. Screening   Diabetes screening. This is done by checking your blood sugar (glucose) after you have not eaten for a while (fasting).  Sexually transmitted disease (STD) testing. Talk with your health care provider about your test results, treatment options, and if necessary, the need for more tests. Follow these instructions at home: Eating and drinking   Eat a diet that includes fresh fruits and vegetables, whole grains, lean protein, and low-fat dairy products.  Take vitamin and mineral supplements as recommended by your health care provider.  Do not drink alcohol if your health care provider tells you not to drink.  If you drink alcohol: ? Limit how much you have to 0-2  drinks a day. ? Be aware of how much alcohol is in your drink. In the [place] one drink equals one 12 oz bottle of beer (355 mL), one 5 oz glass of wine (148 mL), or one 1 oz glass of hard liquor (44 mL). Lifestyle  Take daily care of your teeth and gums.  Stay active. Exercise for at least 30 minutes on 5 or  more days each week.  Do not use any products that contain nicotine or tobacco, such as cigarettes, e-cigarettes, and chewing tobacco. If you need help quitting, ask your health care provider.  If you are sexually active, practice safe sex. Use a condom or other form of protection to prevent STIs (sexually transmitted infections). What's next?  Go to your health care provider once a year for a well check visit.  Ask your health care provider how often you should have your eyes and teeth checked.  Stay up to date on all vaccines. This information is not intended to replace advice given to you by your health care provider. Make sure you discuss any questions you have with your health care provider. Document Revised: 10/26/2018 Document Reviewed: 10/26/2018 Elsevier Patient Education  2020 [employer].

## 2020-02-08 ENCOUNTER — Other Ambulatory Visit: Payer: Self-pay | Admitting: Family Medicine

## 2020-02-08 DIAGNOSIS — F902 Attention-deficit hyperactivity disorder, combined type: Secondary | ICD-10-CM

## 2020-02-11 MED ORDER — AMPHETAMINE-DEXTROAMPHET ER 20 MG PO CP24: 20.0000 mg | ORAL_CAPSULE | Freq: Two times a day (BID) | ORAL | 0 refills | Status: DC

## 2020-02-11 NOTE — Telephone Encounter (Signed)
Rx request 

## 2020-03-24 ENCOUNTER — Other Ambulatory Visit: Payer: Self-pay | Admitting: Family Medicine

## 2020-03-24 DIAGNOSIS — F902 Attention-deficit hyperactivity disorder, combined type: Secondary | ICD-10-CM

## 2020-03-24 NOTE — Telephone Encounter (Signed)
LAST APPOINTMENT DATE: 01/11/2020   NEXT APPOINTMENT DATE: 04/10/2020  Rx Adderall XR   LAST REFILL:02/11/2020  QTY: 60 0RF

## 2020-03-26 MED ORDER — AMPHETAMINE-DEXTROAMPHET ER 20 MG PO CP24: 20.0000 mg | ORAL_CAPSULE | Freq: Two times a day (BID) | ORAL | 0 refills | Status: DC

## 2020-04-10 ENCOUNTER — Ambulatory Visit: Payer: 59 | Admitting: Family Medicine

## 2020-04-18 ENCOUNTER — Ambulatory Visit (INDEPENDENT_AMBULATORY_CARE_PROVIDER_SITE_OTHER): Payer: 59 | Admitting: Family Medicine

## 2020-04-18 ENCOUNTER — Other Ambulatory Visit: Payer: Self-pay

## 2020-04-18 ENCOUNTER — Encounter: Payer: Self-pay | Admitting: Family Medicine

## 2020-04-18 VITALS — BP 102/70 | HR 86 | Temp 97.9°F | Ht 70.0 in | Wt 206.2 lb

## 2020-04-18 DIAGNOSIS — F909 Attention-deficit hyperactivity disorder, unspecified type: Secondary | ICD-10-CM | POA: Insufficient documentation

## 2020-04-18 DIAGNOSIS — F902 Attention-deficit hyperactivity disorder, combined type: Secondary | ICD-10-CM | POA: Insufficient documentation

## 2020-04-18 MED ORDER — AMPHETAMINE-DEXTROAMPHETAMINE 10 MG PO TABS: 10.0000 mg | ORAL_TABLET | Freq: Every evening | ORAL | 0 refills | Status: DC | PRN

## 2020-04-18 NOTE — Progress Notes (Signed)
Patient: Jesse Arellano MRN: [id number] DOB: 20-Oct-1981 PCP: Orma Flaming, MD     Subjective:  Chief Complaint  Patient presents with  . ADD    HPI: The patient is a 39 y.o. male who presents today for ADHD follow up. He was tested at [place] [facility] and was diagnosed with ADHD. I do not have these records.  He is currently on adderall xr 20mg  BID. He sometimes does not take the afternoon PM dose. 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 We have discussed IR as needed for long shifts.    Review of Systems  Constitutional: Negative for chills, fatigue and fever.  HENT: Negative for dental problem, ear pain, hearing loss and trouble swallowing.   Eyes: Negative for visual disturbance.  Respiratory: Negative for cough, chest tightness and shortness of breath.   Cardiovascular: Negative for chest pain, palpitations and leg swelling.  Gastrointestinal: Negative for abdominal pain, blood in stool, diarrhea and nausea.  Endocrine: Negative for cold intolerance, polydipsia, polyphagia and polyuria.  Genitourinary: Negative for dysuria and hematuria.  Musculoskeletal: Negative for arthralgias.  Skin: Negative for rash.  Neurological: Negative for dizziness and headaches.  Psychiatric/Behavioral: Negative for dysphoric mood and sleep disturbance. The patient is not nervous/anxious.     Allergies Patient is allergic to penicillins.  Past Medical History Patient  has a past medical history of Anxiety, Asthma, Depression, GERD (gastroesophageal reflux disease), and Sleep apnea.  Surgical History Patient  has a past surgical history that includes Wisdom tooth extraction and Nasal septoplasty w/ turbinoplasty (Bilateral, 01/22/2019).  Family History Pateint's family history includes Behavior problems in his daughter; Cancer in his father and sister; Diabetes in his mother and sister.  Social History Patient   reports that he has been smoking cigarettes. He has been smoking about 1.00 pack per day. He uses smokeless tobacco. He reports current alcohol use of about 5.0 standard drinks of alcohol per week. He reports that he does not use drugs.    Objective: Vitals:   04/18/20 0931  BP: 102/70  Pulse: 86  Temp: 97.9 F (36.6 C)  TempSrc: Temporal  SpO2: 98%  Weight: 206 lb 3.2 oz (93.5 kg)  Height: 5\' 10"  (1.778 m)    Body mass index is 29.59 kg/m.  Physical Exam Vitals reviewed.  Constitutional:      Appearance: Normal appearance. He is obese.  HENT:     Head: Normocephalic and atraumatic.  Cardiovascular:     Rate and Rhythm: Normal rate and regular rhythm.     Heart sounds: Normal heart sounds.  Pulmonary:     Effort: Pulmonary effort is normal.     Breath sounds: Normal breath sounds.  Abdominal:     General: Abdomen is flat. Bowel sounds are normal.     Palpations: Abdomen is soft.  Musculoskeletal:     Cervical back: Normal range of motion and neck supple.  Skin:    Capillary Refill: Capillary refill takes less than 2 seconds.  Neurological:     General: No focal deficit present.     Mental Status: He is alert and oriented to person, place, and time.  Psychiatric:        Mood and Affect: Mood normal.        Behavior: Behavior normal.        Assessment/plan: 1. Attention deficit hyperactivity disorder (ADHD), unspecified ADHD type Will request records so we can have in his  chart. pmp website verified and reviewed. We discussed different options with his medication. He is doing well on current dosage. I will send in Ir 10mg  and have him use this as needed on 24 hour shifts or if he doesn't take his XR in the afternoon. He will let me know if any issues. F/u in 3 months.    This visit occurred during the SARS-CoV-2 public health emergency.  Safety protocols were in place, including screening questions prior to the visit, additional usage of staff PPE, and extensive  cleaning of exam room while observing appropriate contact time as indicated for disinfecting solutions.     Return in about 3 months (around 07/19/2020) for adhd routine .     09/18/2020, MD [facility]  04/18/2020

## 2020-05-20 ENCOUNTER — Other Ambulatory Visit: Payer: Self-pay | Admitting: Family Medicine

## 2020-05-20 DIAGNOSIS — F902 Attention-deficit hyperactivity disorder, combined type: Secondary | ICD-10-CM

## 2020-05-21 MED ORDER — AMPHETAMINE-DEXTROAMPHET ER 20 MG PO CP24: 20.0000 mg | ORAL_CAPSULE | Freq: Two times a day (BID) | ORAL | 0 refills | Status: DC

## 2020-05-30 ENCOUNTER — Telehealth: Payer: Self-pay | Admitting: Family Medicine

## 2020-05-30 NOTE — Telephone Encounter (Signed)
Patient called would you be able to take his no show fee off he works for the [employer] and was on call that why he missed the appt and its the first one he has missed

## 2020-06-02 NOTE — Telephone Encounter (Signed)
Sent to charge correction to void.

## 2020-07-24 ENCOUNTER — Ambulatory Visit: Payer: 59 | Admitting: Family Medicine

## 2020-07-30 ENCOUNTER — Ambulatory Visit (INDEPENDENT_AMBULATORY_CARE_PROVIDER_SITE_OTHER): Payer: 59 | Admitting: Family Medicine

## 2020-07-30 ENCOUNTER — Encounter: Payer: Self-pay | Admitting: Family Medicine

## 2020-07-30 ENCOUNTER — Other Ambulatory Visit: Payer: Self-pay

## 2020-07-30 VITALS — BP 102/68 | HR 82 | Temp 98.3°F | Ht 70.0 in | Wt 201.8 lb

## 2020-07-30 DIAGNOSIS — F908 Attention-deficit hyperactivity disorder, other type: Secondary | ICD-10-CM

## 2020-07-30 DIAGNOSIS — F902 Attention-deficit hyperactivity disorder, combined type: Secondary | ICD-10-CM

## 2020-07-30 MED ORDER — AMPHETAMINE-DEXTROAMPHET ER 20 MG PO CP24: 20.0000 mg | ORAL_CAPSULE | Freq: Two times a day (BID) | ORAL | 0 refills | Status: DC

## 2020-07-30 NOTE — Patient Instructions (Signed)
1) if you test positive, I need to know right away so I can set you up with infusion. We have here at [facility] I just have to call and get you set up with them.   2) I am encouraging everyone to have a covid kit at home that includes a pulse ox and nebulizer machine. If you get covid you have these two vital things to monitor oxygen and then I do a diluted hydrogen peroxide neb. You need to get the 3% food grade hydrogen peroxide and dilute 50-50 with distilled water or saline (example 3cc hydrogen peroxide with 3 cc water) twice a day. You use mouth piece, not mask. Also if you can get lugol's 1% iodine and put one drop in nebulizer.   ONLY use this if you get covid.   Also while covid is rampant you can take vitamins for prevention then if you get covid, I increase the vitamins and start ivermectin and possibly other steroid/inhaled steroids if needed. Below is prevention protocol.   1) vitamin D3 2000IU/day 2) vitamin C: 500-1042m twice a day 3) quercetin: 50136mdaily 4) melatonin 36m42mefore bedtime (may cause drowsiness)  Gargle mouthwash like scope/act/crest twice a day.   Call me right away if you get covid/set up telehealth appointment so we can discuss treatment and get infusion set up as well.

## 2020-07-30 NOTE — Progress Notes (Signed)
Patient: Jesse Arellano MRN: [id number] DOB: November 29, 1980 PCP: Orland Mustard, MD     Subjective:  Chief Complaint  Patient presents with  . ADHD      The patient is a 39 y.o. male who presents today for ADHD.  He is currently on adderall 20mg XR BIDAnd IR 10mg  as needed for 24 hour shifts at . He really likes this regimen and has had no issues. He will soemtimes forget his XR in the afternoon and take an IR nad this works well for him. No side effects and no issues.    Review of Systems  Constitutional: Negative for chills, fatigue and fever.  HENT: Negative for dental problem, ear pain, hearing loss and trouble swallowing.   Eyes: Negative for visual disturbance.  Respiratory: Negative for cough, chest tightness, shortness of breath and wheezing.   Cardiovascular: Negative for chest pain, palpitations and leg swelling.  Gastrointestinal: Negative for abdominal pain, blood in stool, diarrhea and nausea.  Endocrine: Negative for cold intolerance, polydipsia, polyphagia and polyuria.  Genitourinary: Negative for dysuria and hematuria.  Musculoskeletal: Negative for arthralgias.  Skin: Negative for rash.  Neurological: Negative for dizziness and headaches.  Psychiatric/Behavioral: Negative for dysphoric mood and sleep disturbance. The patient is not nervous/anxious.     Allergies Patient is allergic to penicillins.  Past Medical History Patient  has a past medical history of Anxiety, Asthma, Depression, GERD (gastroesophageal reflux disease), and Sleep apnea.  Surgical History Patient  has a past surgical history that includes Wisdom tooth extraction and Nasal septoplasty w/ turbinoplasty (Bilateral, 01/22/2019).  Family History Pateint's family history includes Behavior problems in his daughter; Cancer in his father and sister; Diabetes in his mother and sister.  Social History Patient  reports that he has been smoking cigarettes. He has been smoking about 1.00  pack per day. He uses smokeless tobacco. He reports current alcohol use of about 5.0 standard drinks of alcohol per week. He reports that he does not use drugs.    Objective: Vitals:   07/30/20 0841  BP: 102/68  Pulse: 82  Temp: 98.3 F (36.8 C)  TempSrc: Temporal  SpO2: 96%  Weight: 201 lb 12.8 oz (91.5 kg)  Height: 5\' 10"  (1.778 m)    Body mass index is 28.96 kg/m.  Physical Exam Vitals reviewed.  Constitutional:      Appearance: Normal appearance. He is normal weight.  HENT:     Head: Normocephalic and atraumatic.  Cardiovascular:     Rate and Rhythm: Normal rate and regular rhythm.  Pulmonary:     Effort: Pulmonary effort is normal.     Breath sounds: Normal breath sounds.  Abdominal:     General: Abdomen is flat. Bowel sounds are normal.     Palpations: Abdomen is soft.  Neurological:     General: No focal deficit present.     Mental Status: He is alert and oriented to person, place, and time.  Psychiatric:        Mood and Affect: Mood normal.        Behavior: Behavior normal.       Assessment/plan: 1. Attention deficit hyperactivity disorder (ADHD), other type Doing well on current regimen. Needs refill of XR. pmp website reviewed and last filled 2 months ago. Refill given. F/u in 3 months or as needed.    - amphetamine-dextroamphetamine (ADDERALL XR) 20 MG 24 hr capsule; Take 1 capsule (20 mg total) by mouth 2 (two) times daily.  Dispense: 60 capsule; Refill: 0  This visit occurred during the SARS-CoV-2 public health emergency.  Safety protocols were in place, including screening questions prior to the visit, additional usage of staff PPE, and extensive cleaning of exam room while observing appropriate contact time as indicated for disinfecting solutions.      Return in about 3 months (around 10/29/2020) for adhd .    Orland Mustard, MD [facility]   07/30/2020'

## 2020-09-05 ENCOUNTER — Encounter: Payer: Self-pay | Admitting: Family Medicine

## 2020-09-05 NOTE — Telephone Encounter (Signed)
Pt have virtual appointment on 09/08/20 @ 11:30 am with Dr. Artis Flock

## 2020-09-08 ENCOUNTER — Ambulatory Visit: Payer: 59 | Admitting: Family Medicine

## 2020-09-24 ENCOUNTER — Other Ambulatory Visit: Payer: Self-pay | Admitting: Family Medicine

## 2020-09-24 DIAGNOSIS — F902 Attention-deficit hyperactivity disorder, combined type: Secondary | ICD-10-CM

## 2020-09-24 MED ORDER — AMPHETAMINE-DEXTROAMPHET ER 20 MG PO CP24: 20.0000 mg | ORAL_CAPSULE | Freq: Two times a day (BID) | ORAL | 0 refills | Status: DC

## 2020-09-24 MED ORDER — AMPHETAMINE-DEXTROAMPHETAMINE 10 MG PO TABS: 10.0000 mg | ORAL_TABLET | Freq: Every evening | ORAL | 0 refills | Status: DC | PRN

## 2020-11-06 ENCOUNTER — Ambulatory Visit (INDEPENDENT_AMBULATORY_CARE_PROVIDER_SITE_OTHER): Payer: 59 | Admitting: Family Medicine

## 2020-11-06 ENCOUNTER — Encounter: Payer: Self-pay | Admitting: Family Medicine

## 2020-11-06 ENCOUNTER — Other Ambulatory Visit: Payer: Self-pay

## 2020-11-06 VITALS — BP 130/78 | HR 81 | Temp 98.3°F | Ht 70.0 in | Wt 204.8 lb

## 2020-11-06 DIAGNOSIS — F908 Attention-deficit hyperactivity disorder, other type: Secondary | ICD-10-CM | POA: Diagnosis not present

## 2020-11-06 DIAGNOSIS — H539 Unspecified visual disturbance: Secondary | ICD-10-CM | POA: Diagnosis not present

## 2020-11-06 NOTE — Patient Instructions (Signed)
1) when you come back in 3 months time for your annual... (come fasting!) will do labs.   2) if you continue to have eye symptoms let me know. Watch for weakness, tingling, fatigue as well. Check blood sugar too.

## 2020-11-06 NOTE — Progress Notes (Signed)
Patient: Jesse Arellano MRN: [id number] DOB: 05-20-1981 PCP: Orland Mustard, MD     Subjective:  Chief Complaint  Patient presents with  . ADHD    HPI: The patient is a 39 y.o. male who presents today for ADHD. Pt says that he has had some dizziness with blurred vision and lightheadedness. He complains of mild nausea when this happens. This is not a daily thing, but it seems to be when he is in  The process of doing things. He states he has not eaten when this happens. Not associated with taking his ADHD medication.  He will get working and not eating breakfast and go 8-9 hours without eating and will be doing something. He bent over and when he got up he was dizzy, light headed and had double vision. Sometimes his vision issue will last for a few minutes. He will have eye strain/double vision when readings. He is farsighted. He thinks he is due for his eye exam. When he eats something he feels much better. He is completely off schedule.   He has no hx of MS or neurological disorder in his family. He has no fatigue, hyperreflexia, weakness, numbness.   ADHD Has had a long drug holiday. Last refilled 09/24/20. He is on adderall 20mg  XR and has an IR as needed for 24 hour shifts. Takes medication as prescribed. Do not feel like his above symptoms are related to medicaiton as he has not taken with symptoms.     Review of Systems  Constitutional: Negative for chills, fatigue and fever.  HENT: Negative for dental problem, ear pain, hearing loss and trouble swallowing.   Eyes: Negative for visual disturbance.  Respiratory: Negative for cough, chest tightness and shortness of breath.   Cardiovascular: Negative for chest pain, palpitations and leg swelling.  Gastrointestinal: Negative for abdominal pain, blood in stool, diarrhea and nausea.  Endocrine: Negative for cold intolerance, polydipsia, polyphagia and polyuria.  Genitourinary: Negative for dysuria and hematuria.  Musculoskeletal:  Negative for arthralgias.  Skin: Negative for rash.  Neurological: Negative for dizziness, light-headedness and headaches.  Psychiatric/Behavioral: Negative for dysphoric mood and sleep disturbance. The patient is not nervous/anxious.     Allergies Patient is allergic to penicillins.  Past Medical History Patient  has a past medical history of Anxiety, Asthma, Depression, GERD (gastroesophageal reflux disease), and Sleep apnea.  Surgical History Patient  has a past surgical history that includes Wisdom tooth extraction and Nasal septoplasty w/ turbinoplasty (Bilateral, 01/22/2019).  Family History Pateint's family history includes Behavior problems in his daughter; Cancer in his father and sister; Diabetes in his mother and sister.  Social History Patient  reports that he has been smoking cigarettes. He has been smoking about 1.00 pack per day. He uses smokeless tobacco. He reports current alcohol use of about 5.0 standard drinks of alcohol per week. He reports that he does not use drugs.    Objective: Vitals:   11/06/20 0912  BP: 130/78  Pulse: 81  Temp: 98.3 F (36.8 C)  TempSrc: Temporal  SpO2: 97%  Weight: 204 lb 12.8 oz (92.9 kg)  Height: 5\' 10"  (1.778 m)    Body mass index is 29.39 kg/m.  Physical Exam Vitals reviewed.  Constitutional:      Appearance: Normal appearance. He is well-developed, normal weight and well-nourished.     Comments: Tobacco odor   HENT:     Right Ear: External ear normal.     Left Ear: External ear normal.  Mouth/Throat:     Mouth: Oropharynx is clear and moist.  Eyes:     Extraocular Movements: EOM normal.     Conjunctiva/sclera: Conjunctivae normal.     Pupils: Pupils are equal, round, and reactive to light.  Neck:     Thyroid: No thyromegaly.  Cardiovascular:     Rate and Rhythm: Normal rate and regular rhythm.     Pulses: Intact distal pulses.     Heart sounds: Normal heart sounds. No murmur heard.   Pulmonary:     Effort:  Pulmonary effort is normal.     Breath sounds: Normal breath sounds.  Abdominal:     General: Bowel sounds are normal. There is no distension.     Palpations: Abdomen is soft.     Tenderness: There is no abdominal tenderness.  Musculoskeletal:     Cervical back: Normal range of motion and neck supple.  Lymphadenopathy:     Cervical: No cervical adenopathy.  Skin:    General: Skin is warm and dry.     Capillary Refill: Capillary refill takes less than 2 seconds.     Findings: No rash.  Neurological:     General: No focal deficit present.     Mental Status: He is alert and oriented to person, place, and time.     Cranial Nerves: No cranial nerve deficit.     Sensory: No sensory deficit.     Motor: No weakness.     Coordination: Coordination normal.     Gait: Gait normal.     Deep Tendon Reflexes: Reflexes normal.  Psychiatric:        Mood and Affect: Mood and affect and mood normal.        Behavior: Behavior normal.        Assessment/plan: 1. Attention deficit hyperactivity disorder (ADHD), other type pmp website checked. Last filled 09/24/20. He doesn't need a refill at this time. Do not think he is having side effects form medication. F/u in 3 months for routine f/u and annual.   2. Vision changes/dizziness  -try small frequent meals. No red flags for neurologicla issues at this time. He is going to get his eyes checked and if continues to happen we will MRI his brain. Discussed things to watch for and he will let me know. Sounds more vaso vagal/hypoglycemia in nature.     This visit occurred during the SARS-CoV-2 public health emergency.  Safety protocols were in place, including screening questions prior to the visit, additional usage of staff PPE, and extensive cleaning of exam room while observing appropriate contact time as indicated for disinfecting solutions.      Return in about 3 months (around 02/04/2021) for adhd routine f/u and ANNUAL> .    Orland Mustard,  MD [facility]   11/06/2020

## 2021-02-13 ENCOUNTER — Ambulatory Visit (INDEPENDENT_AMBULATORY_CARE_PROVIDER_SITE_OTHER): Payer: 59 | Admitting: Family Medicine

## 2021-02-13 ENCOUNTER — Other Ambulatory Visit: Payer: Self-pay

## 2021-02-13 ENCOUNTER — Encounter: Payer: Self-pay | Admitting: Family Medicine

## 2021-02-13 VITALS — BP 120/78 | HR 82 | Temp 98.5°F | Wt 209.2 lb

## 2021-02-13 DIAGNOSIS — E782 Mixed hyperlipidemia: Secondary | ICD-10-CM | POA: Diagnosis not present

## 2021-02-13 DIAGNOSIS — Z0000 Encounter for general adult medical examination without abnormal findings: Secondary | ICD-10-CM | POA: Diagnosis not present

## 2021-02-13 DIAGNOSIS — R7989 Other specified abnormal findings of blood chemistry: Secondary | ICD-10-CM | POA: Diagnosis not present

## 2021-02-13 DIAGNOSIS — F902 Attention-deficit hyperactivity disorder, combined type: Secondary | ICD-10-CM | POA: Diagnosis not present

## 2021-02-13 DIAGNOSIS — Z1159 Encounter for screening for other viral diseases: Secondary | ICD-10-CM

## 2021-02-13 LAB — CBC WITH DIFFERENTIAL/PLATELET
Basophils Absolute: 0.1 10*3/uL (ref 0.0–0.1)
Basophils Relative: 0.7 % (ref 0.0–3.0)
Eosinophils Absolute: 0.4 10*3/uL (ref 0.0–0.7)
Eosinophils Relative: 4.7 % (ref 0.0–5.0)
HCT: 48.7 % (ref 39.0–52.0)
Hemoglobin: 16.5 g/dL (ref 13.0–17.0)
Lymphocytes Relative: 17.8 % (ref 12.0–46.0)
Lymphs Abs: 1.5 10*3/uL (ref 0.7–4.0)
MCHC: 33.8 g/dL (ref 30.0–36.0)
MCV: 88.9 fl (ref 78.0–100.0)
Monocytes Absolute: 0.7 10*3/uL (ref 0.1–1.0)
Monocytes Relative: 8.3 % (ref 3.0–12.0)
Neutro Abs: 5.8 10*3/uL (ref 1.4–7.7)
Neutrophils Relative %: 68.5 % (ref 43.0–77.0)
Platelets: 227 10*3/uL (ref 150.0–400.0)
RBC: 5.48 Mil/uL (ref 4.22–5.81)
RDW: 13.8 % (ref 11.5–15.5)
WBC: 8.5 10*3/uL (ref 4.0–10.5)

## 2021-02-13 LAB — HEPATITIS C ANTIBODY
Hepatitis C Ab: NONREACTIVE
SIGNAL TO CUT-OFF: 0 (ref <1.00)

## 2021-02-13 LAB — TSH: TSH: 3.61 u[IU]/mL (ref 0.35–4.50)

## 2021-02-13 LAB — COMPREHENSIVE METABOLIC PANEL
ALT: 28 U/L (ref 0–53)
AST: 19 U/L (ref 0–37)
Albumin: 4.8 g/dL (ref 3.5–5.2)
Alkaline Phosphatase: 72 U/L (ref 39–117)
BUN: 12 mg/dL (ref 6–23)
CO2: 24 mEq/L (ref 19–32)
Calcium: 9.7 mg/dL (ref 8.4–10.5)
Chloride: 105 mEq/L (ref 96–112)
Creatinine, Ser: 1.12 mg/dL (ref 0.40–1.50)
GFR: 82 mL/min (ref >60.00)
Glucose, Bld: 89 mg/dL (ref 70–99)
Potassium: 4.2 mEq/L (ref 3.5–5.1)
Sodium: 139 mEq/L (ref 135–145)
Total Bilirubin: 0.5 mg/dL (ref 0.2–1.2)
Total Protein: 7.1 g/dL (ref 6.0–8.3)

## 2021-02-13 LAB — LIPID PANEL
Cholesterol: 260 mg/dL — ABNORMAL HIGH (ref 0–200)
HDL: 39.8 mg/dL (ref >39.00)
NonHDL: 220.1
Total CHOL/HDL Ratio: 7
Triglycerides: 282 mg/dL — ABNORMAL HIGH (ref 0.0–149.0)
VLDL: 56.4 mg/dL — ABNORMAL HIGH (ref 0.0–40.0)

## 2021-02-13 LAB — LDL CHOLESTEROL, DIRECT: Direct LDL: 160 mg/dL

## 2021-02-13 LAB — TESTOSTERONE: Testosterone: 391.57 ng/dL (ref 300.00–890.00)

## 2021-02-13 MED ORDER — AMPHETAMINE-DEXTROAMPHET ER 20 MG PO CP24: 20.0000 mg | ORAL_CAPSULE | Freq: Two times a day (BID) | ORAL | 0 refills | Status: DC

## 2021-02-13 NOTE — Patient Instructions (Signed)
-routine labs today.  -refilled your ADHD medication. You need to start taking this DAILY!  -keep an eye on fuse/irritation.  -EXERCISE!!!!!!!  -try to eat better as well.  -fu in 3 months.   Preventive Care 77-41 Years Old, Male Preventive care refers to lifestyle choices and visits with your health care provider that can promote health and wellness. This includes:  A yearly physical exam. This is also called an annual wellness visit.  Regular dental and eye exams.  Immunizations.  Screening for certain conditions.  Healthy lifestyle choices, such as: ? Eating a healthy diet. ? Getting regular exercise. ? Not using drugs or products that contain nicotine and tobacco. ? Limiting alcohol use. What can I expect for my preventive care visit? Physical exam Your health care provider will check your:  Height and weight. These may be used to calculate your BMI (body mass index). BMI is a measurement that tells if you are at a healthy weight.  Heart rate and blood pressure.  Body temperature.  Skin for abnormal spots. Counseling Your health care provider may ask you questions about your:  Past medical problems.  Family's medical history.  Alcohol, tobacco, and drug use.  Emotional well-being.  Home life and relationship well-being.  Sexual activity.  Diet, exercise, and sleep habits.  Work and work Astronomer.  Access to firearms. What immunizations do I need? Vaccines are usually given at various ages, according to a schedule. Your health care provider will recommend vaccines for you based on your age, medical history, and lifestyle or other factors, such as travel or where you work.   What tests do I need? Blood tests  Lipid and cholesterol levels. These may be checked every 5 years, or more often if you are over 99 years old.  Hepatitis C test.  Hepatitis B test. Screening  Lung cancer screening. You may have this screening every year starting at age 54  if you have a 30-pack-year history of smoking and currently smoke or have quit within the past 15 years.  Prostate cancer screening. Recommendations will vary depending on your family history and other risks.  Genital exam to check for testicular cancer or hernias.  Colorectal cancer screening. ? All adults should have this screening starting at age 44 and continuing until age 81. ? Your health care provider may recommend screening at age 44 if you are at increased risk. ? You will have tests every 1-10 years, depending on your results and the type of screening test.  Diabetes screening. ? This is done by checking your blood sugar (glucose) after you have not eaten for a while (fasting). ? You may have this done every 1-3 years.  STD (sexually transmitted disease) testing, if you are at risk. Follow these instructions at home: Eating and drinking  Eat a diet that includes fresh fruits and vegetables, whole grains, lean protein, and low-fat dairy products.  Take vitamin and mineral supplements as recommended by your health care provider.  Do not drink alcohol if your health care provider tells you not to drink.  If you drink alcohol: ? Limit how much you have to 0-2 drinks a day. ? Be aware of how much alcohol is in your drink. In the [place] one drink equals one 12 oz bottle of beer (355 mL), one 5 oz glass of wine (148 mL), or one 1 oz glass of hard liquor (44 mL).   Lifestyle  Take daily care of your teeth and gums. Brush your teeth  every morning and night with fluoride toothpaste. Floss one time each day.  Stay active. Exercise for at least 30 minutes 5 or more days each week.  Do not use any products that contain nicotine or tobacco, such as cigarettes, e-cigarettes, and chewing tobacco. If you need help quitting, ask your health care provider.  Do not use drugs.  If you are sexually active, practice safe sex. Use a condom or other form of protection to prevent STIs (sexually  transmitted infections).  If told by your health care provider, take low-dose aspirin daily starting at age 60.  Find healthy ways to cope with stress, such as: ? Meditation, yoga, or listening to music. ? Journaling. ? Talking to a trusted person. ? Spending time with friends and family. Safety  Always wear your seat belt while driving or riding in a vehicle.  Do not drive: ? If you have been drinking alcohol. Do not ride with someone who has been drinking. ? When you are tired or distracted. ? While texting.  Wear a helmet and other protective equipment during sports activities.  If you have firearms in your house, make sure you follow all gun safety procedures. What's next?  Go to your health care provider once a year for an annual wellness visit.  Ask your health care provider how often you should have your eyes and teeth checked.  Stay up to date on all vaccines. This information is not intended to replace advice given to you by your health care provider. Make sure you discuss any questions you have with your health care provider. Document Revised: 07/31/2019 Document Reviewed: 10/26/2018 Elsevier Patient Education  2021 [employer].

## 2021-02-13 NOTE — Progress Notes (Signed)
Patient: Jesse Arellano MRN: [id number] DOB: [birth year] PCP: Orland Mustard, MD     Subjective:  Chief Complaint  Patient presents with  . Annual Exam  . ADHD    HPI: The patient is a 40 y.o. male who presents today for annual exam. He denies any changes to past medical history. There have been no recent hospitalizations. They are not following a well balanced diet and no exercise plan. Weight has been stable. Also needs follow up of ADHD.   ADHD Has had a long drug holiday. Last refilled 09/24/20. He is on adderall 20mg  XR and has an IR as needed for 24 hour shifts. Takes medication as prescribed. He is not doing well as he stopped taking medication and takes sporadically. He last refilled 30 pills back in 10/04/20. He also has 10mg  IR to take as needed as he does 24 hour shifts (only has used 10 pills in the last 5 months). He forgets to take this and then takes it later. He is not depressed, but is burned out.   Immunization History  Administered Date(s) Administered  . Tdap 01/06/2012   Colonoscopy: routine screening  PSA: routine screening  Review of Systems  Allergies Patient is allergic to penicillins.  Past Medical History Patient  has a past medical history of Anxiety, Asthma, Depression, GERD (gastroesophageal reflux disease), and Sleep apnea.  Surgical History Patient  has a past surgical history that includes Wisdom tooth extraction and Nasal septoplasty w/ turbinoplasty (Bilateral, 01/22/2019).  Family History Pateint's family history includes Behavior problems in his daughter; Cancer in his father and sister; Diabetes in his mother and sister.  Social History Patient  reports that he has been smoking cigarettes. He has been smoking about 1.00 pack per day. He uses smokeless tobacco. He reports current alcohol use of about 5.0 standard drinks of alcohol per week. He reports that he does not use drugs.    Objective: Vitals:   02/13/21 0845  BP: 120/78   Pulse: 82  Temp: 98.5 F (36.9 C)  SpO2: 97%  Weight: 209 lb 3.2 oz (94.9 kg)    Body mass index is 30.02 kg/m.  Physical Exam Vitals reviewed.  Constitutional:      Appearance: Normal appearance. He is well-developed and normal weight.  HENT:     Head: Normocephalic and atraumatic.     Right Ear: Tympanic membrane, ear canal and external ear normal.     Left Ear: Tympanic membrane, ear canal and external ear normal.     Nose: Nose normal.     Mouth/Throat:     Mouth: Mucous membranes are moist.  Eyes:     Extraocular Movements: Extraocular movements intact.     Conjunctiva/sclera: Conjunctivae normal.     Pupils: Pupils are equal, round, and reactive to light.  Neck:     Thyroid: No thyromegaly.     Vascular: No carotid bruit.  Cardiovascular:     Rate and Rhythm: Normal rate and regular rhythm.     Pulses: Normal pulses.     Heart sounds: Normal heart sounds. No murmur heard.   Pulmonary:     Effort: Pulmonary effort is normal.     Breath sounds: Normal breath sounds.  Abdominal:     General: Abdomen is flat. Bowel sounds are normal. There is no distension.     Palpations: Abdomen is soft.     Tenderness: There is no abdominal tenderness.  Musculoskeletal:     Cervical back: Normal range of motion and  neck supple.  Lymphadenopathy:     Cervical: No cervical adenopathy.  Skin:    General: Skin is warm and dry.     Capillary Refill: Capillary refill takes less than 2 seconds.     Findings: No rash.  Neurological:     General: No focal deficit present.     Mental Status: He is alert and oriented to person, place, and time.     Cranial Nerves: No cranial nerve deficit.     Coordination: Coordination normal.     Deep Tendon Reflexes: Reflexes normal.  Psychiatric:        Mood and Affect: Mood normal.        Behavior: Behavior normal.    Flowsheet Row Office Visit from 02/13/2021 in [facility]  PHQ-2 Total Score 0          Assessment/plan: 1. Annual physical exam Routine labs today. HM reviewed. Checking hep C today. Really encouraged him to start regular exercise. 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 Hard for good sleep pattern and exercise pattern. F/u in one year.  Patient counseling [x]    Nutrition: Stressed importance of moderation in sodium/caffeine intake, saturated fat and cholesterol, caloric balance, sufficient intake of fresh fruits, vegetables, fiber, calcium, iron, and 1 mg of folate supplement per day (for females capable of pregnancy).  [x]    Stressed the importance of regular exercise.   []    Substance Abuse: Discussed cessation/primary prevention of tobacco, alcohol, or other drug use; driving or other dangerous activities under the influence; availability of treatment for abuse.   [x]    Injury prevention: Discussed safety belts, safety helmets, smoke detector, smoking near bedding or upholstery.   [x]    Sexuality: Discussed sexually transmitted diseases, partner selection, use of condoms, avoidance of unintended pregnancy  and contraceptive alternatives.  [x]    Dental health: Discussed importance of regular tooth brushing, flossing, and dental visits.  [x]    Health maintenance and immunizations reviewed. Please refer to Health maintenance section.    - CBC with Differential/Platelet - Comprehensive metabolic panel - Lipid panel - TSH  2. Attention deficit hyperactivity disorder (ADHD), combined type Has not been taking his medication at all or very sporadically and he is all over the place. Affecting his marriage. He is going to set an alarm and take his medication in the AM daily like he should. No signs of depression. I think he has more ADHD than anxiety. He can't get anything done and he is going 90 miles a minute. He was well controlled on his medication so just need to get him back to his routine of taking as he is supposed. Routine f/u in 3 months time or sooner if needed.  pmp website verified.  - amphetamine-dextroamphetamine (ADDERALL XR) 20 MG 24 hr capsule; Take 1 capsule (20 mg total) by mouth 2 (two) times daily.  Dispense: 60 capsule; Refill: 0  3. Mixed hyperlipidemia The 10-year ASCVD risk score DC Jr., et al., 2013) is: 7.7% Repeat testing today.   4. Encounter for hepatitis C screening test for low risk patient  - Hepatitis C antibody  5. Low testosterone  - Testosterone  This visit occurred during the SARS-CoV-2 public health emergency.  Safety protocols were in place, including screening questions prior to the visit, additional usage of staff PPE, and extensive cleaning of exam room while observing appropriate contact time as indicated for disinfecting solutions.    Return in about 3 months (  around 05/15/2021) for routine adhd f/u .     Orland Mustard, MD [facility]  02/13/2021

## 2021-03-04 ENCOUNTER — Other Ambulatory Visit: Payer: Self-pay | Admitting: Family Medicine

## 2021-03-04 ENCOUNTER — Encounter: Payer: Self-pay | Admitting: Family Medicine

## 2021-03-04 DIAGNOSIS — F902 Attention-deficit hyperactivity disorder, combined type: Secondary | ICD-10-CM

## 2021-03-04 MED ORDER — AMPHETAMINE-DEXTROAMPHET ER 20 MG PO CP24: 20.0000 mg | ORAL_CAPSULE | Freq: Two times a day (BID) | ORAL | 0 refills | Status: DC

## 2021-04-04 ENCOUNTER — Encounter: Payer: Self-pay | Admitting: Family Medicine

## 2021-04-06 ENCOUNTER — Other Ambulatory Visit: Payer: Self-pay | Admitting: Family Medicine

## 2021-04-06 DIAGNOSIS — F902 Attention-deficit hyperactivity disorder, combined type: Secondary | ICD-10-CM

## 2021-04-06 MED ORDER — AMPHETAMINE-DEXTROAMPHET ER 20 MG PO CP24: 20.0000 mg | ORAL_CAPSULE | Freq: Two times a day (BID) | ORAL | 0 refills | Status: DC

## 2021-04-07 NOTE — Telephone Encounter (Signed)
I spoke to pt to address concerns below. Pt was confused about how a Prior Auth is submitted and why he needs a PA when medication has been approved in the past. I had not received Prior Auth from the pharmacy to submit PA. Pt touched base with the pharmacy and was able to tell them to resend the request. PA has been received and submitted.  Cover My Meds  Key Code: [id number]  Pending for approval.

## 2021-04-07 NOTE — Telephone Encounter (Signed)
I called pt to make him aware that Prior Auth has been submitted and is pending for approval. Pt voiced understanding.

## 2021-04-08 NOTE — Telephone Encounter (Signed)
Cover My Meds:  Prior Auth for Adderall XR 20 mg er capsule was approved.  Request Ref# [id number]  Covered 04/07/2021-04/07/2022.  Lvm to make pt aware.

## 2021-07-07 ENCOUNTER — Encounter: Payer: Self-pay | Admitting: Physician Assistant

## 2021-07-07 ENCOUNTER — Other Ambulatory Visit: Payer: Self-pay

## 2021-07-07 ENCOUNTER — Ambulatory Visit: Payer: 59 | Admitting: Physician Assistant

## 2021-07-07 VITALS — BP 119/81 | HR 70 | Temp 98.0°F | Ht 70.0 in | Wt 207.2 lb

## 2021-07-07 DIAGNOSIS — F902 Attention-deficit hyperactivity disorder, combined type: Secondary | ICD-10-CM

## 2021-07-07 MED ORDER — AMPHETAMINE-DEXTROAMPHET ER 20 MG PO CP24
20.0000 mg | ORAL_CAPSULE | Freq: Two times a day (BID) | ORAL | 0 refills | Status: DC
Start: 2021-07-07 — End: 2022-02-05

## 2021-07-07 MED ORDER — AMPHETAMINE-DEXTROAMPHETAMINE 10 MG PO TABS: 10.0000 mg | ORAL_TABLET | Freq: Every evening | ORAL | 0 refills | Status: DC | PRN

## 2021-07-07 NOTE — Patient Instructions (Signed)
Great to meet you today! Glad everything is going well. Best wishes to your daughter in her senior year this year. Medications refilled.  See you back in 3 months for next med check.

## 2021-07-07 NOTE — Progress Notes (Signed)
Established Patient Office Visit  Subjective:  Patient ID: Jesse Arellano, male    DOB: 23-Sep-1981  Age: 40 y.o. MRN: [id number]  CC:  Chief Complaint  Patient presents with   Medication Management    HPI [name] presents for medication management and TOC from Dr. Artis Flock.   ADHD - Started on medication with Dr. Earlene Plater. Formal testing through [facility] confirmed this diagnosis. He has been doing well with Adderall XR 20 mg BID daily and the Adderall 10 mg rarely - only needed for extended hours with his work shifts. Says it has really changed his life. His sleep patterns are better and he is able to stay on task now. No complaints. No side effects.   Past Medical History:  Diagnosis Date   Anxiety    Asthma    allergy induced in childhood   Depression    GERD (gastroesophageal reflux disease)    Sleep apnea     Past Surgical History:  Procedure Laterality Date   NASAL SEPTOPLASTY W/ TURBINOPLASTY Bilateral 01/22/2019   Procedure: NASAL SEPTOPLASTY WITH BILATERAL TURBINATE REDUCTION;  Surgeon: Newman Pies, MD;  Location: [facility];  Service: ENT;  Laterality: Bilateral;   WISDOM TOOTH EXTRACTION      Family History  Problem Relation Age of Onset   Diabetes Mother    Cancer Father    Diabetes Sister    Cancer Sister    Behavior problems Daughter     Social History   Socioeconomic History   Marital status: Married    Spouse name: Not on file   Number of children: 1   Years of education: Not on file   Highest education level: Not on file  Occupational History   Occupation: Theatre stage manager  Tobacco Use   Smoking status: Every Day    Packs/day: 1.00    Types: Cigarettes   Smokeless tobacco: Current  Vaping Use   Vaping Use: Never used  Substance and Sexual Activity   Alcohol use: Yes    Alcohol/week: 5.0 standard drinks    Types: 5 Cans of beer per week   Drug use: No   Sexual activity: Yes    Partners:  Female  Other Topics Concern   Not on file  Social History Narrative   Musician. Shifts alternate frequently. Daughter with mental health issues. 01/05/2017    Social Determinants of Health   Financial Resource Strain: Not on file  Food Insecurity: Not on file  Transportation Needs: Not on file  Physical Activity: Not on file  Stress: Not on file  Social Connections: Not on file  Intimate Partner Violence: Not on file    Outpatient Medications Prior to Visit  Medication Sig Dispense Refill   cetirizine (ZYRTEC) 10 MG tablet Take 10 mg by mouth daily.     amphetamine-dextroamphetamine (ADDERALL XR) 20 MG 24 hr capsule Take 1 capsule (20 mg total) by mouth 2 (two) times daily. 60 capsule 0   amphetamine-dextroamphetamine (ADDERALL) 10 MG tablet Take 1 tablet (10 mg total) by mouth at bedtime as needed. 30 tablet 0   No facility-administered medications prior to visit.    Allergies  Allergen Reactions   Penicillins     REACTION: hives and itching as child    ROS Review of Systems REFER TO HPI FOR PERTINENT POSITIVES AND NEGATIVES    Objective:    Physical Exam Constitutional:      Appearance: Normal appearance. He is normal weight.  Cardiovascular:     Rate and Rhythm: Normal rate and regular rhythm.     Pulses: Normal pulses.  Pulmonary:     Effort: Pulmonary effort is normal.     Breath sounds: Normal breath sounds.  Neurological:     General: No focal deficit present.     Mental Status: He is alert.  Psychiatric:        Mood and Affect: Mood normal.        Behavior: Behavior normal.        Thought Content: Thought content normal.    BP 119/81   Pulse 70   Temp 98 F (36.7 C)   Ht 5\' 10"  (1.778 m)   Wt 207 lb 3.2 oz (94 kg)   SpO2 98%   BMI 29.73 kg/m  Wt Readings from Last 3 Encounters:  07/07/21 207 lb 3.2 oz (94 kg)  02/13/21 209 lb 3.2 oz (94.9 kg)  11/06/20 204 lb 12.8 oz (92.9 kg)     Health Maintenance Due  Topic Date Due    Pneumococcal Vaccine 49-64 Years old (1 - PCV) Never done    There are no preventive care reminders to display for this patient.  Lab Results  Component Value Date   TSH 3.61 02/13/2021   Lab Results  Component Value Date   WBC 8.5 02/13/2021   HGB 16.5 02/13/2021   HCT 48.7 02/13/2021   MCV 88.9 02/13/2021   PLT 227.0 02/13/2021   Lab Results  Component Value Date   NA 139 02/13/2021   K 4.2 02/13/2021   CO2 24 02/13/2021   GLUCOSE 89 02/13/2021   BUN 12 02/13/2021   CREATININE 1.12 02/13/2021   BILITOT 0.5 02/13/2021   ALKPHOS 72 02/13/2021   AST 19 02/13/2021   ALT 28 02/13/2021   PROT 7.1 02/13/2021   ALBUMIN 4.8 02/13/2021   CALCIUM 9.7 02/13/2021   GFR 82.00 02/13/2021   Lab Results  Component Value Date   CHOL 260 (H) 02/13/2021   Lab Results  Component Value Date   HDL 39.80 02/13/2021   Lab Results  Component Value Date   LDLCALC 158 (H) 01/11/2020   Lab Results  Component Value Date   TRIG 282.0 (H) 02/13/2021   Lab Results  Component Value Date   CHOLHDL 7 02/13/2021   No results found for: HGBA1C    Assessment & Plan:   Problem List Items Addressed This Visit       Other   ADHD - Primary   Relevant Medications   amphetamine-dextroamphetamine (ADDERALL XR) 20 MG 24 hr capsule    Meds ordered this encounter  Medications   amphetamine-dextroamphetamine (ADDERALL XR) 20 MG 24 hr capsule    Sig: Take 1 capsule (20 mg total) by mouth 2 (two) times daily.    Dispense:  60 capsule    Refill:  0   amphetamine-dextroamphetamine (ADDERALL) 10 MG tablet    Sig: Take 1 tablet (10 mg total) by mouth at bedtime as needed.    Dispense:  30 tablet    Refill:  0    Follow-up: Return in about 3 months (around 10/07/2021) for Med check .   1. Attention deficit hyperactivity disorder (ADHD), combined type -PDMP reviewed and no red flags identified -Refilled his Adderall XR 20 mg Bid and Adderall 10 mg qd -Stable, doing well -F/up in 3  months   Aniela Caniglia M Peter Keyworth, PA-C

## 2021-10-14 ENCOUNTER — Other Ambulatory Visit: Payer: Self-pay

## 2021-10-14 ENCOUNTER — Ambulatory Visit: Payer: 59 | Admitting: Physician Assistant

## 2021-10-14 ENCOUNTER — Encounter: Payer: Self-pay | Admitting: Physician Assistant

## 2021-10-14 VITALS — BP 112/72 | HR 74 | Temp 97.8°F | Ht 70.0 in | Wt 209.2 lb

## 2021-10-14 DIAGNOSIS — F902 Attention-deficit hyperactivity disorder, combined type: Secondary | ICD-10-CM

## 2021-10-14 DIAGNOSIS — R21 Rash and other nonspecific skin eruption: Secondary | ICD-10-CM | POA: Diagnosis not present

## 2021-10-14 MED ORDER — CLOTRIMAZOLE-BETAMETHASONE 1-0.05 % EX CREA: 1.0000 "application " | TOPICAL_CREAM | Freq: Two times a day (BID) | CUTANEOUS | 0 refills | Status: DC

## 2021-10-14 MED ORDER — AMPHETAMINE-DEXTROAMPHETAMINE 10 MG PO TABS: 10.0000 mg | ORAL_TABLET | Freq: Every evening | ORAL | 0 refills | Status: DC | PRN

## 2021-10-14 MED ORDER — AMPHETAMINE-DEXTROAMPHET ER 20 MG PO CP24: 20.0000 mg | ORAL_CAPSULE | Freq: Two times a day (BID) | ORAL | 0 refills | Status: DC

## 2021-10-14 NOTE — Patient Instructions (Signed)
Use the cream as directed, no longer than 14 days consistently. Recheck if worse or no improvement.  Rx's refilled. Get back on track using an alarm on phone or texts from wife to help.  Enjoy your cruise in March and congrats to your girls on their graduations!

## 2021-10-14 NOTE — Progress Notes (Signed)
Subjective:    Patient ID: Jesse Arellano, male    DOB: Oct 29, 1981, 40 y.o.   MRN: [id number]  Chief Complaint  Patient presents with   Follow-up   Rash    chest    Rash  Patient is in today for follow up.  07-07-21:  ADHD - Started on medication with Dr. Earlene Plater. Formal testing through [facility] confirmed this diagnosis. He has been doing well with Adderall XR 20 mg BID daily and the Adderall 10 mg rarely - only needed for extended hours with his work shifts. Says it has really changed his life. His sleep patterns are better and he is able to stay on task now. No complaints. No side effects.  ADHD - Started on medication with Dr. Earlene Plater. Formal testing through [facility] confirmed this diagnosis. He has been doing well with Adderall XR 20 mg BID daily and the Adderall 10 mg rarely - only needed for extended hours with his work shifts. Says it has really changed his life. His sleep patterns are better and he is able to stay on task now. No complaints. No side effects.    TODAY: -Says he has been doing horrible with taking his medications and staying on track; not-intentional, says he just gets busy and forgets about it -Mood swings, irritability, fatigue - wife definitely notices when he is off his meds. He needs refills and wants to get back on track.   New rash on chest in the last few months. Tried anti-fungal cream without much relief. 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 Also hunting and has been sweating more.    Past Medical History:  Diagnosis Date   Anxiety    Asthma    allergy induced in childhood   Depression    GERD (gastroesophageal reflux disease)    Sleep apnea     Past Surgical History:  Procedure Laterality Date   NASAL SEPTOPLASTY W/ TURBINOPLASTY Bilateral 01/22/2019   Procedure: NASAL SEPTOPLASTY WITH BILATERAL TURBINATE REDUCTION;  Surgeon: Newman Pies, MD;  Location: [facility];   Service: ENT;  Laterality: Bilateral;   WISDOM TOOTH EXTRACTION      Family History  Problem Relation Age of Onset   Diabetes Mother    Cancer Father    Diabetes Sister    Cancer Sister    Behavior problems Daughter     Social History   Tobacco Use   Smoking status: Every Day    Packs/day: 1.00    Types: Cigarettes   Smokeless tobacco: Current  Vaping Use   Vaping Use: Never used  Substance Use Topics   Alcohol use: Yes    Alcohol/week: 5.0 standard drinks    Types: 5 Cans of beer per week   Drug use: No     Allergies  Allergen Reactions   Penicillins     REACTION: hives and itching as child    Review of Systems  Skin:  Positive for rash.  NEGATIVE UNLESS OTHERWISE INDICATED IN HPI      Objective:     BP 112/72   Pulse 74   Temp 97.8 F (36.6 C)   Ht 5\' 10"  (1.778 m)   Wt 209 lb 3.2 oz (94.9 kg)   SpO2 99%   BMI 30.02 kg/m   Wt Readings from Last 3 Encounters:  10/14/21 209 lb 3.2 oz (94.9 kg)  07/07/21 207 lb 3.2 oz (94 kg)  02/13/21 209 lb 3.2 oz (  94.9 kg)    BP Readings from Last 3 Encounters:  10/14/21 112/72  07/07/21 119/81  02/13/21 120/78     Physical Exam Constitutional:      Appearance: Normal appearance. He is normal weight.  Cardiovascular:     Rate and Rhythm: Normal rate and regular rhythm.     Pulses: Normal pulses.  Pulmonary:     Effort: Pulmonary effort is normal.     Breath sounds: Normal breath sounds.  Skin:         Comments: Slightly raised erythematous dermatitis in line matching description of his gear   Neurological:     General: No focal deficit present.     Mental Status: He is alert.  Psychiatric:        Mood and Affect: Mood normal.        Behavior: Behavior normal.        Thought Content: Thought content normal.       Assessment & Plan:   Problem List Items Addressed This Visit       Other   ADHD - Primary   Other Visit Diagnoses     Rash and nonspecific skin eruption             Meds ordered this encounter  Medications   clotrimazole-betamethasone (LOTRISONE) cream    Sig: Apply 1 application topically 2 (two) times daily.    Dispense:  45 g    Refill:  0   amphetamine-dextroamphetamine (ADDERALL XR) 20 MG 24 hr capsule    Sig: Take 1 capsule (20 mg total) by mouth in the morning and at bedtime.    Dispense:  60 capsule    Refill:  0   amphetamine-dextroamphetamine (ADDERALL XR) 20 MG 24 hr capsule    Sig: Take 1 capsule (20 mg total) by mouth 2 (two) times daily.    Dispense:  60 capsule    Refill:  0   amphetamine-dextroamphetamine (ADDERALL XR) 20 MG 24 hr capsule    Sig: Take 1 capsule (20 mg total) by mouth in the morning and at bedtime.    Dispense:  60 capsule    Refill:  0   amphetamine-dextroamphetamine (ADDERALL) 10 MG tablet    Sig: Take 1 tablet (10 mg total) by mouth at bedtime as needed.    Dispense:  30 tablet    Refill:  0   1. Attention deficit hyperactivity disorder (ADHD), combined type -PDMP reviewed today, no red flags, filling appropriately.  -Rx's for Adderall refilled.  -Discussed ways to help get back on track with medication regimen daily. -F/up 3 months  2. Rash and nonspecific skin eruption -Lotrisone cream as directed -Try moisture wicking material  -Recheck prn   Harlan Vinal M Rosilyn Coachman, PA-C

## 2022-01-27 ENCOUNTER — Ambulatory Visit: Payer: 59 | Admitting: Physician Assistant

## 2022-02-05 ENCOUNTER — Ambulatory Visit: Payer: 59 | Admitting: Physician Assistant

## 2022-02-05 ENCOUNTER — Encounter: Payer: Self-pay | Admitting: Physician Assistant

## 2022-02-05 VITALS — BP 112/78 | HR 82 | Temp 98.3°F | Resp 16 | Wt 209.8 lb

## 2022-02-05 DIAGNOSIS — F902 Attention-deficit hyperactivity disorder, combined type: Secondary | ICD-10-CM | POA: Diagnosis not present

## 2022-02-05 DIAGNOSIS — R197 Diarrhea, unspecified: Secondary | ICD-10-CM | POA: Diagnosis not present

## 2022-02-05 DIAGNOSIS — R350 Frequency of micturition: Secondary | ICD-10-CM

## 2022-02-05 DIAGNOSIS — F17210 Nicotine dependence, cigarettes, uncomplicated: Secondary | ICD-10-CM

## 2022-02-05 LAB — POCT URINALYSIS DIPSTICK
Bilirubin, UA: NEGATIVE
Blood, UA: NEGATIVE
Glucose, UA: NEGATIVE
Ketones, UA: NEGATIVE
Leukocytes, UA: NEGATIVE
Nitrite, UA: NEGATIVE
Protein, UA: POSITIVE — AB
Spec Grav, UA: >=1.03 — AB (ref 1.010–1.025)
Urobilinogen, UA: 0.2 E.U./dL
pH, UA: 5.5 (ref 5.0–8.0)

## 2022-02-05 MED ORDER — AMPHETAMINE-DEXTROAMPHET ER 20 MG PO CP24
20.0000 mg | ORAL_CAPSULE | Freq: Two times a day (BID) | ORAL | 0 refills | Status: DC
Start: 2022-02-05 — End: 2022-05-13

## 2022-02-05 NOTE — Patient Instructions (Addendum)
Check POC urine in office today.  ? ?Please try to wean back from caffeine consumption to see if this improves urinary frequency / stools. ?Strongly consider quitting smoking / ways to do this - ?hypnosis therapy. ? ?See you back in 4 weeks to discuss symptoms. Call sooner if any changes.  ?

## 2022-02-05 NOTE — Progress Notes (Signed)
? ?Subjective:  ? ? Patient ID: Jesse Arellano, male    DOB: 03-25-1981, 41 y.o.   MRN: [id number] ? ?Chief Complaint  ?Patient presents with  ? ADHD  ?  Patient states that he has been better with his medication and everything has been going well  ? ? ?HPI ?Patient is in today for recheck on ADHD.  Patient has been taking Adderall XR 20 mg twice daily and being more consistent with the medication.  States that he is doing well and not having any side effects.  He has not needed the immediate release Adderall 10 mg in the evening time.  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 Daughter graduating college and other daughter graduating high school this year. ? ?Also -  ?Still smoking about 1 ppd. ?Tried Chantix previously & tried cold [place] ?Longest w/o cigs is 8 months ? ?He is concerned about his cancer risk due to smoking history as well as his family history.  Father passed away at age 53 from pancreatic cancer.  Sister recently diagnosed with renal cancer.  Maternal grandfather with lung cancer.  Patient states that recently he has noticed looser stools and more urinary frequency.  Not sure if this is related to Adderall use or not but wanted to bring it up today. ? ?Past Medical History:  ?Diagnosis Date  ? Anxiety   ? Asthma   ? allergy induced in childhood  ? Depression   ? GERD (gastroesophageal reflux disease)   ? Sleep apnea   ? ? ?Past Surgical History:  ?Procedure Laterality Date  ? NASAL SEPTOPLASTY W/ TURBINOPLASTY Bilateral 01/22/2019  ? Procedure: NASAL SEPTOPLASTY WITH BILATERAL TURBINATE REDUCTION;  Surgeon: Newman Pies, MD;  Location: [facility];  Service: ENT;  Laterality: Bilateral;  ? WISDOM TOOTH EXTRACTION    ? ? ?Family History  ?Problem Relation Age of Onset  ? Diabetes Mother   ? Cancer Father   ?     pancreatic  ? Diabetes Sister   ? Cancer Sister   ?     thyroid  ? Kidney cancer Sister   ? Diabetes Sister   ? Lung cancer Maternal Grandfather   ? Behavior  problems Daughter   ? ? ?Social History  ? ?Tobacco Use  ? Smoking status: Every Day  ?  Packs/day: 1.00  ?  Types: Cigarettes  ? Smokeless tobacco: Current  ?Vaping Use  ? Vaping Use: Never used  ?Substance Use Topics  ? Alcohol use: Yes  ?  Alcohol/week: 5.0 standard drinks  ?  Types: 5 Cans of beer per week  ? Drug use: No  ?  ? ?Allergies  ?Allergen Reactions  ? Penicillins   ?  REACTION: hives and itching as child  ? ? ?Review of Systems ?NEGATIVE UNLESS OTHERWISE INDICATED IN HPI ? ? ?   ?Objective:  ?  ? ?BP 112/78   Pulse 82   Temp 98.3 ?F (36.8 ?C)   Resp 16   Wt 209 lb 12.8 oz (95.2 kg)   SpO2 96%   BMI 30.10 kg/m?  ? ?Wt Readings from Last 3 Encounters:  ?02/05/22 209 lb 12.8 oz (95.2 kg)  ?10/14/21 209 lb 3.2 oz (94.9 kg)  ?07/07/21 207 lb 3.2 oz (94 kg)  ? ? ?BP Readings from Last 3 Encounters:  ?02/05/22 112/78  ?10/14/21 112/72  ?07/07/21 119/81  ?  ? ?Physical Exam ?Vitals and nursing note reviewed.  ?Constitutional:   ?  General: He is not in acute distress. ?   Appearance: Normal appearance. He is not toxic-appearing.  ?HENT:  ?   Head: Normocephalic and atraumatic.  ?   Right Ear: External ear normal.  ?   Left Ear: External ear normal.  ?   Nose: Nose normal.  ?   Mouth/Throat:  ?   Mouth: Mucous membranes are moist.  ?   Pharynx: Oropharynx is clear.  ?Eyes:  ?   Extraocular Movements: Extraocular movements intact.  ?   Conjunctiva/sclera: Conjunctivae normal.  ?   Pupils: Pupils are equal, round, and reactive to light.  ?Cardiovascular:  ?   Rate and Rhythm: Normal rate and regular rhythm.  ?   Pulses: Normal pulses.  ?   Heart sounds: Normal heart sounds.  ?Pulmonary:  ?   Effort: Pulmonary effort is normal.  ?   Breath sounds: Normal breath sounds.  ?Abdominal:  ?   General: Abdomen is flat. Bowel sounds are normal.  ?   Palpations: Abdomen is soft.  ?   Tenderness: There is no abdominal tenderness. There is no right CVA tenderness or left CVA tenderness.  ?Musculoskeletal:     ?    General: Normal range of motion.  ?   Cervical back: Normal range of motion and neck supple.  ?Skin: ?   General: Skin is warm and dry.  ?Neurological:  ?   General: No focal deficit present.  ?   Mental Status: He is alert and oriented to person, place, and time.  ?Psychiatric:     ?   Mood and Affect: Mood normal.     ?   Behavior: Behavior normal.  ? ? ?   ?Assessment & Plan:  ? ?Problem List Items Addressed This Visit   ? ?  ? Other  ? Attention deficit hyperactivity disorder (ADHD), combined type - Primary  ? ?Other Visit Diagnoses   ? ? Urinary frequency      ? Relevant Orders  ? POCT urinalysis dipstick (Completed)  ? Diarrhea, unspecified type      ? Cigarette nicotine dependence without complication      ? ?  ? ? ? ?Meds ordered this encounter  ?Medications  ? amphetamine-dextroamphetamine (ADDERALL XR) 20 MG 24 hr capsule  ?  Sig: Take 1 capsule (20 mg total) by mouth 2 (two) times daily.  ?  Dispense:  60 capsule  ?  Refill:  0  ? ?Plan: ? ?1. Attention deficit hyperactivity disorder (ADHD), combined type ?PDMP reviewed today, no red flags, filling appropriately.  ?Stable. ?Refilled Adderall XR 20 mg BID #30, ?F/up in 3 months ? ?2. Urinary frequency ?3. Diarrhea, unspecified type ?POC urine shows signs of dehydration ?Consuming more caffeine than water ?Plan to cut back on this and see if symptoms improve in the next 4 weeks ?Discussed genetic counseling referral due to fam hx as well ? ?4. Cigarette nicotine dependence without complication ?Needs to quit and he knows this ?Consider hypnosis therapy or patches ? ? ?This note was prepared with assistance of Conservation officer, historic buildings. Occasional wrong-word or sound-a-like substitutions may have occurred due to the inherent limitations of voice recognition software. ? ? ?Kemari Narez M Nana Vastine, PA-C ?

## 2022-03-04 ENCOUNTER — Ambulatory Visit: Payer: 59 | Admitting: Physician Assistant

## 2022-04-27 ENCOUNTER — Ambulatory Visit: Payer: 59 | Admitting: Physician Assistant

## 2022-05-06 ENCOUNTER — Ambulatory Visit: Payer: 59 | Admitting: Physician Assistant

## 2022-05-13 ENCOUNTER — Ambulatory Visit (INDEPENDENT_AMBULATORY_CARE_PROVIDER_SITE_OTHER): Payer: 59 | Admitting: Physician Assistant

## 2022-05-13 ENCOUNTER — Encounter: Payer: Self-pay | Admitting: Physician Assistant

## 2022-05-13 VITALS — BP 128/81 | HR 93 | Temp 98.0°F | Ht 70.0 in | Wt 209.2 lb

## 2022-05-13 DIAGNOSIS — Z125 Encounter for screening for malignant neoplasm of prostate: Secondary | ICD-10-CM | POA: Diagnosis not present

## 2022-05-13 DIAGNOSIS — F17210 Nicotine dependence, cigarettes, uncomplicated: Secondary | ICD-10-CM

## 2022-05-13 DIAGNOSIS — Z809 Family history of malignant neoplasm, unspecified: Secondary | ICD-10-CM | POA: Diagnosis not present

## 2022-05-13 DIAGNOSIS — E782 Mixed hyperlipidemia: Secondary | ICD-10-CM

## 2022-05-13 DIAGNOSIS — Z23 Encounter for immunization: Secondary | ICD-10-CM | POA: Diagnosis not present

## 2022-05-13 DIAGNOSIS — R7989 Other specified abnormal findings of blood chemistry: Secondary | ICD-10-CM | POA: Diagnosis not present

## 2022-05-13 DIAGNOSIS — F902 Attention-deficit hyperactivity disorder, combined type: Secondary | ICD-10-CM | POA: Diagnosis not present

## 2022-05-13 DIAGNOSIS — Z0000 Encounter for general adult medical examination without abnormal findings: Secondary | ICD-10-CM | POA: Diagnosis not present

## 2022-05-13 DIAGNOSIS — Z716 Tobacco abuse counseling: Secondary | ICD-10-CM

## 2022-05-13 LAB — COMPREHENSIVE METABOLIC PANEL
ALT: 29 U/L (ref 0–53)
AST: 21 U/L (ref 0–37)
Albumin: 5.1 g/dL (ref 3.5–5.2)
Alkaline Phosphatase: 69 U/L (ref 39–117)
BUN: 14 mg/dL (ref 6–23)
CO2: 27 mEq/L (ref 19–32)
Calcium: 9.8 mg/dL (ref 8.4–10.5)
Chloride: 104 mEq/L (ref 96–112)
Creatinine, Ser: 1.19 mg/dL (ref 0.40–1.50)
GFR: 75.58 mL/min (ref >60.00)
Glucose, Bld: 94 mg/dL (ref 70–99)
Potassium: 4.1 mEq/L (ref 3.5–5.1)
Sodium: 139 mEq/L (ref 135–145)
Total Bilirubin: 0.7 mg/dL (ref 0.2–1.2)
Total Protein: 7.3 g/dL (ref 6.0–8.3)

## 2022-05-13 LAB — LIPID PANEL
Cholesterol: 232 mg/dL — ABNORMAL HIGH (ref 0–200)
HDL: 36.3 mg/dL — ABNORMAL LOW (ref >39.00)
NonHDL: 195.81
Total CHOL/HDL Ratio: 6
Triglycerides: 235 mg/dL — ABNORMAL HIGH (ref 0.0–149.0)
VLDL: 47 mg/dL — ABNORMAL HIGH (ref 0.0–40.0)

## 2022-05-13 LAB — CBC WITH DIFFERENTIAL/PLATELET
Basophils Absolute: 0.1 10*3/uL (ref 0.0–0.1)
Basophils Relative: 0.8 % (ref 0.0–3.0)
Eosinophils Absolute: 0.4 10*3/uL (ref 0.0–0.7)
Eosinophils Relative: 5.1 % — ABNORMAL HIGH (ref 0.0–5.0)
HCT: 48.6 % (ref 39.0–52.0)
Hemoglobin: 16.4 g/dL (ref 13.0–17.0)
Lymphocytes Relative: 19.9 % (ref 12.0–46.0)
Lymphs Abs: 1.7 10*3/uL (ref 0.7–4.0)
MCHC: 33.7 g/dL (ref 30.0–36.0)
MCV: 90.9 fl (ref 78.0–100.0)
Monocytes Absolute: 0.8 10*3/uL (ref 0.1–1.0)
Monocytes Relative: 9 % (ref 3.0–12.0)
Neutro Abs: 5.5 10*3/uL (ref 1.4–7.7)
Neutrophils Relative %: 65.2 % (ref 43.0–77.0)
Platelets: 226 10*3/uL (ref 150.0–400.0)
RBC: 5.35 Mil/uL (ref 4.22–5.81)
RDW: 14.2 % (ref 11.5–15.5)
WBC: 8.5 10*3/uL (ref 4.0–10.5)

## 2022-05-13 LAB — LDL CHOLESTEROL, DIRECT: Direct LDL: 156 mg/dL

## 2022-05-13 LAB — TESTOSTERONE: Testosterone: 339.36 ng/dL (ref 300.00–890.00)

## 2022-05-13 LAB — PSA: PSA: 1.07 ng/mL (ref 0.10–4.00)

## 2022-05-13 MED ORDER — NICOTINE 21 MG/24HR TD PT24: 21.0000 mg | MEDICATED_PATCH | Freq: Every day | TRANSDERMAL | 1 refills | Status: AC

## 2022-05-13 NOTE — Progress Notes (Signed)
Subjective:    Patient ID: Jesse Arellano, male    DOB: [birth year], 41 y.o.   MRN: [id number]  Chief Complaint  Patient presents with   Annual Exam    HPI Patient is in today for annual exam. -COVID-19 in May last month - kept him down for about a week. Still having some joint pain / soreness. Taking about 2-3 ibuprofen daily spread out. Having trouble remembering to take his Adderall.  Acute concerns: He's ready to quit smoking - up to 1.5 to 2 ppd   Health maintenance: Lifestyle/ exercise: stays busy at work, irregular schedule, inconsistent exercise Nutrition: needs to improve Mental health: Stressed, normal life stressors  Sleep: Irregular given work schedule; occasionally has to nap Substance use: Smoking cigarettes - considering quitting  Sexual activity: Monogamous  Immunizations: UTD   Past Medical History:  Diagnosis Date   Anxiety    Asthma    allergy induced in childhood   Depression    GERD (gastroesophageal reflux disease)    Sleep apnea     Past Surgical History:  Procedure Laterality Date   NASAL SEPTOPLASTY W/ TURBINOPLASTY Bilateral 01/22/2019   Procedure: NASAL SEPTOPLASTY WITH BILATERAL TURBINATE REDUCTION;  Surgeon: Newman Pies, MD;  Location: [facility];  Service: ENT;  Laterality: Bilateral;   WISDOM TOOTH EXTRACTION      Family History  Problem Relation Age of Onset   Diabetes Mother    Cancer Father        pancreatic   Diabetes Sister    Cancer Sister        thyroid   Kidney cancer Sister    Diabetes Sister    Lung cancer Maternal Grandfather    Behavior problems Daughter     Social History   Tobacco Use   Smoking status: Every Day    Packs/day: 1.00    Types: Cigarettes   Smokeless tobacco: Current  Vaping Use   Vaping Use: Never used  Substance Use Topics   Alcohol use: Yes    Alcohol/week: 5.0 standard drinks of alcohol    Types: 5 Cans of beer per week   Drug use: No     Allergies  Allergen  Reactions   Penicillins     REACTION: hives and itching as child    Review of Systems NEGATIVE UNLESS OTHERWISE INDICATED IN HPI      Objective:     BP 128/81   Pulse 93   Temp 98 F (36.7 C) (Temporal)   Ht 5\' 10"  (1.778 m)   Wt 209 lb 3.2 oz (94.9 kg)   SpO2 96%   BMI 30.02 kg/m   Wt Readings from Last 3 Encounters:  05/13/22 209 lb 3.2 oz (94.9 kg)  02/05/22 209 lb 12.8 oz (95.2 kg)  10/14/21 209 lb 3.2 oz (94.9 kg)    BP Readings from Last 3 Encounters:  05/13/22 128/81  02/05/22 112/78  10/14/21 112/72     Physical Exam Vitals and nursing note reviewed.  Constitutional:      General: He is not in acute distress.    Appearance: Normal appearance. He is not toxic-appearing.  HENT:     Head: Normocephalic and atraumatic.     Right Ear: Tympanic membrane, ear canal and external ear normal.     Left Ear: Tympanic membrane, ear canal and external ear normal.     Nose: Nose normal.     Mouth/Throat:     Mouth: Mucous membranes are moist.  Pharynx: Oropharynx is clear.  Eyes:     Extraocular Movements: Extraocular movements intact.     Conjunctiva/sclera: Conjunctivae normal.     Pupils: Pupils are equal, round, and reactive to light.  Cardiovascular:     Rate and Rhythm: Normal rate and regular rhythm.     Pulses: Normal pulses.     Heart sounds: Normal heart sounds.  Pulmonary:     Effort: Pulmonary effort is normal.     Breath sounds: Normal breath sounds.  Abdominal:     General: Abdomen is flat. Bowel sounds are normal.     Palpations: Abdomen is soft.     Tenderness: There is no abdominal tenderness.  Musculoskeletal:        General: Normal range of motion.     Cervical back: Normal range of motion and neck supple.  Skin:    General: Skin is warm and dry.  Neurological:     General: No focal deficit present.     Mental Status: He is alert and oriented to person, place, and time.  Psychiatric:        Mood and Affect: Mood normal.         Behavior: Behavior normal.        Assessment & Plan:   Problem List Items Addressed This Visit       Other   Mixed hyperlipidemia   Relevant Orders   Lipid panel (Completed)   Low testosterone   Relevant Orders   Testosterone (Completed)   Attention deficit hyperactivity disorder (ADHD), combined type   Other Visit Diagnoses     Encounter for annual physical exam    -  Primary   Relevant Orders   CBC with Differential/Platelet (Completed)   Comprehensive metabolic panel (Completed)   Lipid panel (Completed)   PSA (Completed)   Testosterone (Completed)   Prostate cancer screening       Relevant Orders   PSA (Completed)   Family history of cancer       Relevant Orders   Ambulatory referral to Genetics   Cigarette nicotine dependence without complication       Relevant Medications   nicotine (NICODERM CQ - DOSED IN MG/24 HOURS) 21 mg/24hr patch       PLAN: Age-appropriate screening and counseling performed today. Will check labs and call with results. Preventive measures discussed and printed in AVS for patient. Hep A vaccine updated per work orders. Referral to genetics.  Patient Counseling: [x]   Nutrition: Stressed importance of moderation in sodium/caffeine intake, saturated fat and cholesterol, caloric balance, sufficient intake of fresh fruits, vegetables, and fiber.  [x]   Stressed the importance of regular exercise.   [x]   Substance Abuse: Discussed cessation/primary prevention of tobacco, alcohol, or other drug use; driving or other dangerous activities under the influence; availability of treatment for abuse.   []   Injury prevention: Discussed safety belts, safety helmets, smoke detector, smoking near bedding or upholstery.   []   Sexuality: Discussed sexually transmitted diseases, partner selection, use of condoms, avoidance of unintended pregnancy  and contraceptive alternatives.   [x]   Dental health: Discussed importance of regular tooth brushing, flossing,  and dental visits.  [x]   Health maintenance and immunizations reviewed. Please refer to Health maintenance section.    ADHD: PDMP reviewed today, no red flags, filling appropriately.  Not consistent with medication lately due to work schedule; ready to get back on track. Rx's refilled; cont Adderall XR 20 mg BID; Adderall 10 mg prn bedtime if evening shift Call  if concerns  Smoking status: Motivated to quit Counseling about Nicoderm patches; start with 21 mg patch x 6 weeks, then move onto 14 mg patch; pt to call with updates / questions / concerns   Return in about 3 months (around 08/13/2022) for recheck ADHD / smoking status .  This note was prepared with assistance of Conservation officer, historic buildings. Occasional wrong-word or sound-a-like substitutions may have occurred due to the inherent limitations of voice recognition software.    Melani Brisbane M Laylaa Guevarra, PA-C

## 2022-05-14 ENCOUNTER — Telehealth: Payer: Self-pay | Admitting: Licensed Clinical Social Worker

## 2022-05-14 NOTE — Telephone Encounter (Signed)
Scheduled appt per 6/29 referral. Pt is aware of appt date and time. Pt is aware to arrive 15 mins prior to appt time and to bring and updated insurance card. Pt is aware of appt location.   

## 2022-05-18 ENCOUNTER — Other Ambulatory Visit: Payer: Self-pay | Admitting: Physician Assistant

## 2022-05-18 MED ORDER — ATORVASTATIN CALCIUM 10 MG PO TABS: 10.0000 mg | ORAL_TABLET | Freq: Every day | ORAL | 0 refills | Status: DC

## 2022-05-18 MED ORDER — AMPHETAMINE-DEXTROAMPHET ER 20 MG PO CP24: 20.0000 mg | ORAL_CAPSULE | Freq: Two times a day (BID) | ORAL | 0 refills | Status: DC

## 2022-05-18 NOTE — Patient Instructions (Addendum)
Referral to geneticist to discuss risk / family cancer history  Start on Nicotine patches! Proud of you! You can do this!   Fasting labs today, will send MyChart message with results.   Hep A vaccine update per work requirement

## 2022-06-23 ENCOUNTER — Encounter (INDEPENDENT_AMBULATORY_CARE_PROVIDER_SITE_OTHER): Payer: Self-pay

## 2022-07-05 ENCOUNTER — Encounter: Payer: Self-pay | Admitting: Licensed Clinical Social Worker

## 2022-07-05 ENCOUNTER — Other Ambulatory Visit: Payer: Self-pay

## 2022-07-05 ENCOUNTER — Inpatient Hospital Stay: Payer: 59 | Attending: Physician Assistant | Admitting: Licensed Clinical Social Worker

## 2022-07-05 ENCOUNTER — Inpatient Hospital Stay: Payer: 59

## 2022-07-05 DIAGNOSIS — Z800 Family history of malignant neoplasm of digestive organs: Secondary | ICD-10-CM | POA: Diagnosis not present

## 2022-07-05 DIAGNOSIS — Z801 Family history of malignant neoplasm of trachea, bronchus and lung: Secondary | ICD-10-CM | POA: Diagnosis not present

## 2022-07-05 DIAGNOSIS — Z808 Family history of malignant neoplasm of other organs or systems: Secondary | ICD-10-CM | POA: Diagnosis not present

## 2022-07-05 DIAGNOSIS — Z8051 Family history of malignant neoplasm of kidney: Secondary | ICD-10-CM | POA: Diagnosis not present

## 2022-07-05 NOTE — Progress Notes (Signed)
REFERRING PROVIDER: Fredirick Lathe, PA-C [place],  [place] [address]  PRIMARY PROVIDER:  Allwardt, Randa Evens, PA-C  PRIMARY REASON FOR VISIT:  1. Family history of pancreatic cancer   2. Family history of kidney cancer   3. Family history of thyroid cancer   4. Family history of lung cancer      HISTORY OF PRESENT ILLNESS:   Jesse Arellano, a 41 y.o. male, was seen for a [place] cancer genetics consultation at the request of Dr. Brand Males due to a family history of pancreatic cancer.  Jesse Arellano presents to clinic today to discuss the possibility of a hereditary predisposition to cancer, genetic testing, and to further clarify his future cancer risks, as well as potential cancer risks for family members.   CANCER HISTORY:  Jesse Arellano is a 41 y.o. male with no personal history of cancer.  He has not had a colonoscopy yet. He reportedly had normal PSA recently.   Past Medical History:  Diagnosis Date   Anxiety    Asthma    allergy induced in childhood   Depression    Family history of pancreatic cancer    GERD (gastroesophageal reflux disease)    Sleep apnea     Past Surgical History:  Procedure Laterality Date   NASAL SEPTOPLASTY W/ TURBINOPLASTY Bilateral 01/22/2019   Procedure: NASAL SEPTOPLASTY WITH BILATERAL TURBINATE REDUCTION;  Surgeon: Leta Baptist, MD;  Location: [facility];  Service: ENT;  Laterality: Bilateral;   WISDOM TOOTH EXTRACTION      FAMILY HISTORY:  We obtained a detailed, 4-generation family history.  Significant diagnoses are listed below: Family History  Problem Relation Age of Onset   Diabetes Mother    Cancer Father 60       pancreatic   Diabetes Sister    Cancer Sister 25       thyroid   Kidney cancer Sister 79   Diabetes Sister    Lung cancer Maternal Aunt    Lung cancer Paternal Aunt    Cancer Paternal Uncle        unk type   Lung cancer Maternal Grandfather    Lung cancer Paternal Grandmother     Behavior problems Daughter    Jesse Arellano has 1 biological daughter, 64, and 1 adopted daughter, 25. He has 3 sisters. One sister had thyroid cancer at 67, another sister had kidney cancer at 9.   Jesse Arellano mother is living. Her sister, patient's aunt, had lung cancer and history of smoking. Patient's maternal grandfather also had lung cancer, history of tobacco use, and passed at 15.   Jesse Arellano father passed at 76 of pancreatic cancer, no history of smoking/alcohol use. Patient had 2 paternal uncles and 1 aunt. His aunt had lung cancer and died in her 71s. His uncle had cancer, unknown type, and passed in his 13s. Paternal grandmother had lung cancer and history of smoking.   Mr. Advincula is unaware of previous family history of genetic testing for hereditary cancer risks. There is no reported Ashkenazi Jewish ancestry. There is no known consanguinity.    GENETIC COUNSELING ASSESSMENT: Jesse Arellano is a 41 y.o. male with a family history of pancreatic and other cancers which is somewhat suggestive of a hereditary cancer syndrome and predisposition to cancer. We, therefore, discussed and recommended the following at today's visit.   DISCUSSION: We discussed that approximately 10% of pancreatic cancer is hereditary. Most cases of hereditary pancreatic cancer are associated with BRCA1/BRCA2  genes, although there are other genes associated with hereditary cancer as well, including ones associated with kidney and thyroid cancer. Cancers and risks are gene specific. We discussed that testing is beneficial for several reasons including knowing about cancer risks, identifying potential screening and risk-reduction options that may be appropriate, and to understand if other family members could be at risk for cancer and allow them to undergo genetic testing.   We reviewed the characteristics, features and inheritance patterns of hereditary cancer syndromes. We also discussed genetic  testing, including the appropriate family members to test, the process of testing, insurance coverage and turn-around-time for results. We discussed the implications of a negative, positive and/or variant of uncertain significant result. We recommended Jesse Arellano pursue genetic testing for the [facility] Multi-Cancer+RNA gene panel.   Based on Jesse Arellano's family history of cancer, he meets medical criteria for genetic testing. Despite that he meets criteria, he may still have an out of pocket cost. We discussed that if his out of pocket cost for testing is over $100, the laboratory will call and confirm whether he wants to proceed with testing.  If the out of pocket cost of testing is less than $100 he will be billed by the genetic testing laboratory.   We discussed that some people do not want to undergo genetic testing due to fear of genetic discrimination.  A federal law called the Genetic Information Non-Discrimination Act (GINA) of 2008 helps protect individuals against genetic discrimination based on their genetic test results.  It impacts both health insurance and employment.  For health insurance, it protects against increased premiums, being kicked off insurance or being forced to take a test in order to be insured.  For employment it protects against hiring, firing and promoting decisions based on genetic test results.  Health status due to a cancer diagnosis is not protected under GINA.  This law does not protect life insurance, disability insurance, or other types of insurance.   PLAN: After considering the risks, benefits, and limitations, Jesse Arellano  did not wish to pursue genetic testing at today's visit.  We will do a benefits investigation and let him know what his estimated cost is before proceeding with testing. We understand this decision and remain available to coordinate genetic testing at any time in the future. We, therefore, recommend Jesse Arellano continue to follow the  cancer screening guidelines given by his primary healthcare provider.  Jesse Arellano questions were answered to his satisfaction today. Our contact information was provided should additional questions or concerns arise. Thank you for the referral and allowing [place] to share in the care of your patient.   Jesse Rogue, Jesse Arellano, [facility] Genetic Counselor [email or web address] Phone: [phone]  The patient was seen for a total of 45 minutes in face-to-face genetic counseling.  [employer] intern Jesse Arellano was also present. Dr. Grayland Ormond was available for discussion regarding this case.   _______________________________________________________________________ For Office Staff:  Number of people involved in session: 2 Was an Intern/ student involved with case: yes

## 2022-07-14 ENCOUNTER — Encounter: Payer: Self-pay | Admitting: Licensed Clinical Social Worker

## 2022-08-13 ENCOUNTER — Encounter: Payer: Self-pay | Admitting: Physician Assistant

## 2022-08-13 ENCOUNTER — Ambulatory Visit: Payer: 59 | Admitting: Physician Assistant

## 2022-08-13 VITALS — BP 118/80 | HR 78 | Temp 97.8°F | Ht 70.0 in | Wt 210.6 lb

## 2022-08-13 DIAGNOSIS — F902 Attention-deficit hyperactivity disorder, combined type: Secondary | ICD-10-CM | POA: Diagnosis not present

## 2022-08-13 DIAGNOSIS — F17200 Nicotine dependence, unspecified, uncomplicated: Secondary | ICD-10-CM | POA: Diagnosis not present

## 2022-08-13 DIAGNOSIS — E782 Mixed hyperlipidemia: Secondary | ICD-10-CM | POA: Diagnosis not present

## 2022-08-13 MED ORDER — AMPHETAMINE-DEXTROAMPHET ER 20 MG PO CP24: 20.0000 mg | ORAL_CAPSULE | Freq: Two times a day (BID) | ORAL | 0 refills | Status: DC

## 2022-08-13 MED ORDER — ATORVASTATIN CALCIUM 10 MG PO TABS: 10.0000 mg | ORAL_TABLET | Freq: Every day | ORAL | 1 refills | Status: DC

## 2022-08-13 NOTE — Progress Notes (Signed)
Subjective:    Patient ID: Jesse Arellano, male    DOB: Dec 13, 1980, 41 y.o.   MRN: [id number]  Chief Complaint  Patient presents with   Follow-up    Pt in for f/u appt; ADHD follow up; pt states no concerns, tried to stop smoking using patch not successful; taking all meds as prescribed; pt is interested in rechecking labs at next appt to see if his lipids have improved.    HPI Patient is in today for recheck smoking, ADHD, cholesterol.   Total of 4 weeks with patches, got down to under 1/2 ppd. Then went on vacation to beach. Still has reduction in smoking overall, but not where he was with the patch.  Successful with Chantix - first time had quit about 8 months; second trial had a lot of night terrors. Wife also smokes about 5-10 cigarettes in the evenings.   Adderall and atorvastatin daily. Doing better to remember to take medications, happy about this.   Working out 15 min increments at work, some high intensity exercises. Has to be careful about left shoulder and left wrist - some prior injuries.   Past Medical History:  Diagnosis Date   Anxiety    Asthma    allergy induced in childhood   Depression    Family history of pancreatic cancer    GERD (gastroesophageal reflux disease)    Sleep apnea     Past Surgical History:  Procedure Laterality Date   NASAL SEPTOPLASTY W/ TURBINOPLASTY Bilateral 01/22/2019   Procedure: NASAL SEPTOPLASTY WITH BILATERAL TURBINATE REDUCTION;  Surgeon: Leta Baptist, MD;  Location: [facility];  Service: ENT;  Laterality: Bilateral;   WISDOM TOOTH EXTRACTION      Family History  Problem Relation Age of Onset   Diabetes Mother    Cancer Father 51       pancreatic   Diabetes Sister    Cancer Sister 38       thyroid   Kidney cancer Sister 14   Diabetes Sister    Lung cancer Maternal Aunt    Lung cancer Paternal Aunt    Cancer Paternal Uncle        unk type   Lung cancer Maternal Grandfather    Lung cancer Paternal  Grandmother    Behavior problems Daughter     Social History   Tobacco Use   Smoking status: Every Day    Packs/day: 1.00    Types: Cigarettes   Smokeless tobacco: Current  Vaping Use   Vaping Use: Never used  Substance Use Topics   Alcohol use: Yes    Alcohol/week: 5.0 standard drinks of alcohol    Types: 5 Cans of beer per week   Drug use: No     Allergies  Allergen Reactions   Penicillins     REACTION: hives and itching as child    Review of Systems NEGATIVE UNLESS OTHERWISE INDICATED IN HPI      Objective:     BP 118/80 (BP Location: Left Arm)   Pulse 78   Temp 97.8 F (36.6 C) (Temporal)   Ht 5\' 10"  (1.778 m)   Wt 210 lb 9.6 oz (95.5 kg)   SpO2 95%   BMI 30.22 kg/m   Wt Readings from Last 3 Encounters:  08/13/22 210 lb 9.6 oz (95.5 kg)  05/13/22 209 lb 3.2 oz (94.9 kg)  02/05/22 209 lb 12.8 oz (95.2 kg)    BP Readings from Last 3 Encounters:  08/13/22 118/80  05/13/22 128/81  02/05/22 112/78     Physical Exam Constitutional:      Appearance: Normal appearance.  Cardiovascular:     Rate and Rhythm: Normal rate and regular rhythm.     Pulses: Normal pulses.     Heart sounds: Normal heart sounds. No murmur heard. Pulmonary:     Effort: Pulmonary effort is normal.     Breath sounds: Normal breath sounds.  Neurological:     General: No focal deficit present.     Mental Status: He is alert and oriented to person, place, and time.  Psychiatric:        Mood and Affect: Mood normal.        Behavior: Behavior normal.        Assessment & Plan:  Attention deficit hyperactivity disorder (ADHD), combined type Assessment & Plan: Stable Remembering to take medications daily PDMP reviewed today, no red flags, filling appropriately.   Adderall XR 20 mg BID Adderall 10 mg immediate release in evenings prn depending on work    Mixed hyperlipidemia Assessment & Plan: Recheck fasting lipid panel next visit Refilled Lipitor 10 mg daily, doing  great with compliance   Smoker Assessment & Plan: Still highly motivated Encouraged him to try the patch again - he will start this over the weekend   Other orders -     Amphetamine-Dextroamphet ER; Take 1 capsule (20 mg total) by mouth 2 (two) times daily.  Dispense: 60 capsule; Refill: 0 -     Atorvastatin Calcium; Take 1 tablet (10 mg total) by mouth daily. Take to help lower cholesterol.  Dispense: 90 tablet; Refill: 1       Return in about 3 months (around 11/12/2022) for recheck ADHD, smoking .  This note was prepared with assistance of Conservation officer, historic buildings. Occasional wrong-word or sound-a-like substitutions may have occurred due to the inherent limitations of voice recognition software.    Domonique Brouillard M Shakerria Parran, PA-C

## 2022-08-13 NOTE — Assessment & Plan Note (Signed)
Stable Remembering to take medications daily PDMP reviewed today, no red flags, filling appropriately.   Adderall XR 20 mg BID Adderall 10 mg immediate release in evenings prn depending on work

## 2022-08-13 NOTE — Assessment & Plan Note (Signed)
Still highly motivated Encouraged him to try the patch again - he will start this over the weekend

## 2022-08-13 NOTE — Assessment & Plan Note (Signed)
Recheck fasting lipid panel next visit Refilled Lipitor 10 mg daily, doing great with compliance

## 2022-10-29 ENCOUNTER — Other Ambulatory Visit: Payer: Self-pay | Admitting: Physician Assistant

## 2022-10-29 MED ORDER — AMPHETAMINE-DEXTROAMPHET ER 20 MG PO CP24: 20.0000 mg | ORAL_CAPSULE | Freq: Two times a day (BID) | ORAL | 0 refills | Status: DC

## 2022-11-17 ENCOUNTER — Ambulatory Visit: Payer: 59 | Admitting: Physician Assistant

## 2022-11-17 VITALS — BP 130/82 | HR 94 | Temp 97.1°F | Ht 70.0 in | Wt 211.6 lb

## 2022-11-17 DIAGNOSIS — Z23 Encounter for immunization: Secondary | ICD-10-CM

## 2022-11-17 DIAGNOSIS — F902 Attention-deficit hyperactivity disorder, combined type: Secondary | ICD-10-CM | POA: Diagnosis not present

## 2022-11-17 DIAGNOSIS — F17200 Nicotine dependence, unspecified, uncomplicated: Secondary | ICD-10-CM

## 2022-11-17 DIAGNOSIS — E782 Mixed hyperlipidemia: Secondary | ICD-10-CM

## 2022-11-17 DIAGNOSIS — J0100 Acute maxillary sinusitis, unspecified: Secondary | ICD-10-CM

## 2022-11-17 LAB — LIPID PANEL
Cholesterol: 195 mg/dL (ref 0–200)
HDL: 35.2 mg/dL — ABNORMAL LOW (ref >39.00)
NonHDL: 159.36
Total CHOL/HDL Ratio: 6
Triglycerides: 275 mg/dL — ABNORMAL HIGH (ref 0.0–149.0)
VLDL: 55 mg/dL — ABNORMAL HIGH (ref 0.0–40.0)

## 2022-11-17 LAB — LDL CHOLESTEROL, DIRECT: Direct LDL: 121 mg/dL

## 2022-11-17 MED ORDER — PREDNISONE 20 MG PO TABS: 20.0000 mg | ORAL_TABLET | Freq: Two times a day (BID) | ORAL | 0 refills | Status: AC

## 2022-11-17 MED ORDER — DOXYCYCLINE HYCLATE 100 MG PO TABS: 100.0000 mg | ORAL_TABLET | Freq: Two times a day (BID) | ORAL | 0 refills | Status: AC

## 2022-11-17 MED ORDER — BENZONATATE 100 MG PO CAPS: 100.0000 mg | ORAL_CAPSULE | Freq: Three times a day (TID) | ORAL | 0 refills | Status: DC | PRN

## 2022-11-17 NOTE — Assessment & Plan Note (Signed)
Adderall XR 20 mg BID Adderall 10 mg IR in evenings prn work  Not consistent in December due to illness Insurance will no longer be covering this medication Pt will call insurance and let me know what will be covered in future for ADHD

## 2022-11-17 NOTE — Assessment & Plan Note (Signed)
Difficulty quitting; not ready to try anything different at this time

## 2022-11-17 NOTE — Progress Notes (Signed)
Subjective:    Patient ID: Jesse Arellano, male    DOB: July 17, 1981, 42 y.o.   MRN: [id number]  Chief Complaint  Patient presents with   ADHD    Pt in office for ADHD and to discuss smoking status; pt still smoking but still trying;     HPI Patient is in today for 3 month recheck.  Hasn't been taking adderall consistently the last few weeks since being sick. Still smoking. Taking atorvastatin consistently.   Sick symptoms x 3 weeks - congestion now yellow / green productive, cough; Nasal spray, albuterol inhaler, Mucinex DM, tessalon perles, promethazine cough syrup - 10/29/22 from doctor on demand.  Past Medical History:  Diagnosis Date   Anxiety    Asthma    allergy induced in childhood   Depression    Family history of pancreatic cancer    GERD (gastroesophageal reflux disease)    Sleep apnea     Past Surgical History:  Procedure Laterality Date   NASAL SEPTOPLASTY W/ TURBINOPLASTY Bilateral 01/22/2019   Procedure: NASAL SEPTOPLASTY WITH BILATERAL TURBINATE REDUCTION;  Surgeon: Leta Baptist, MD;  Location: [facility];  Service: ENT;  Laterality: Bilateral;   WISDOM TOOTH EXTRACTION      Family History  Problem Relation Age of Onset   Diabetes Mother    Cancer Father 61       pancreatic   Diabetes Sister    Cancer Sister 5       thyroid   Kidney cancer Sister 78   Diabetes Sister    Lung cancer Maternal Aunt    Lung cancer Paternal Aunt    Cancer Paternal Uncle        unk type   Lung cancer Maternal Grandfather    Lung cancer Paternal Grandmother    Behavior problems Daughter     Social History   Tobacco Use   Smoking status: Every Day    Packs/day: 1.00    Types: Cigarettes   Smokeless tobacco: Current  Vaping Use   Vaping Use: Never used  Substance Use Topics   Alcohol use: Yes    Alcohol/week: 5.0 standard drinks of alcohol    Types: 5 Cans of beer per week   Drug use: No     Allergies  Allergen Reactions   Penicillins      REACTION: hives and itching as child    Review of Systems NEGATIVE UNLESS OTHERWISE INDICATED IN HPI      Objective:     BP 130/82 (BP Location: Right Arm)   Pulse 94   Temp (!) 97.1 F (36.2 C) (Temporal)   Ht 5\' 10"  (1.778 m)   Wt 211 lb 9.6 oz (96 kg)   SpO2 98%   BMI 30.36 kg/m   Wt Readings from Last 3 Encounters:  11/17/22 211 lb 9.6 oz (96 kg)  08/13/22 210 lb 9.6 oz (95.5 kg)  05/13/22 209 lb 3.2 oz (94.9 kg)    BP Readings from Last 3 Encounters:  11/17/22 130/82  08/13/22 118/80  05/13/22 128/81     Physical Exam Vitals and nursing note reviewed.  Constitutional:      General: He is not in acute distress.    Appearance: Normal appearance. He is not ill-appearing.  HENT:     Head: Normocephalic.     Right Ear: Tympanic membrane, ear canal and external ear normal.     Left Ear: Tympanic membrane, ear canal and external ear normal.     Nose:  Congestion present.     Mouth/Throat:     Mouth: Mucous membranes are moist.     Pharynx: No oropharyngeal exudate or posterior oropharyngeal erythema.  Eyes:     Extraocular Movements: Extraocular movements intact.     Conjunctiva/sclera: Conjunctivae normal.     Pupils: Pupils are equal, round, and reactive to light.  Cardiovascular:     Rate and Rhythm: Normal rate and regular rhythm.     Pulses: Normal pulses.     Heart sounds: Normal heart sounds. No murmur heard. Pulmonary:     Effort: Pulmonary effort is normal. No respiratory distress.     Breath sounds: Wheezing present.  Musculoskeletal:     Cervical back: Normal range of motion.  Skin:    General: Skin is warm.  Neurological:     Mental Status: He is alert and oriented to person, place, and time.  Psychiatric:        Mood and Affect: Mood normal.        Behavior: Behavior normal.        Assessment & Plan:  Attention deficit hyperactivity disorder (ADHD), combined type Assessment & Plan: Adderall XR 20 mg BID Adderall 10 mg IR in  evenings prn work  Not consistent in December due to illness Insurance will no longer be covering this medication Pt will call insurance and let me know what will be covered in future for ADHD   Mixed hyperlipidemia Assessment & Plan: Not fasting lipid panel today; adjust lipitor 10 mg as needed  Orders: -     Lipid panel  Smoker Assessment & Plan: Difficulty quitting; not ready to try anything different at this time    Acute maxillary sinusitis, recurrence not specified  Need for prophylactic vaccination and inoculation against viral hepatitis -     Hepatitis A vaccine adult IM  Other orders -     predniSONE; Take 1 tablet (20 mg total) by mouth 2 (two) times daily with a meal for 5 days.  Dispense: 10 tablet; Refill: 0 -     Doxycycline Hyclate; Take 1 tablet (100 mg total) by mouth 2 (two) times daily for 7 days.  Dispense: 14 tablet; Refill: 0 -     Benzonatate; Take 1 capsule (100 mg total) by mouth 3 (three) times daily as needed for cough.  Dispense: 30 capsule; Refill: 0   Will start empiric treatment for sinusitis with doxycycline and prednisone.  Recommended supportive care otherwise including the use of oral antihistamine, decongestant, tessalon perles. Counseled patient on potential for adverse effects with medications prescribed/recommended today, ER and return-to-clinic precautions discussed, patient verbalized understanding.     Return in about 3 months (around 02/16/2023) for recheck .  This note was prepared with assistance of Systems analyst. Occasional wrong-word or sound-a-like substitutions may have occurred due to the inherent limitations of voice recognition software.     Saraiah Bhat M Dhiya Smits, PA-C

## 2022-11-17 NOTE — Assessment & Plan Note (Signed)
Not fasting lipid panel today; adjust lipitor 10 mg as needed

## 2022-11-19 ENCOUNTER — Other Ambulatory Visit: Payer: Self-pay

## 2022-11-19 MED ORDER — ATORVASTATIN CALCIUM 20 MG PO TABS: 20.0000 mg | ORAL_TABLET | Freq: Every day | ORAL | 3 refills | Status: DC

## 2022-11-19 MED ORDER — ATORVASTATIN CALCIUM 20 MG PO TABS: 20.0000 mg | ORAL_TABLET | Freq: Every day | ORAL | 1 refills | Status: DC

## 2023-03-17 ENCOUNTER — Ambulatory Visit: Payer: 59 | Admitting: Physician Assistant

## 2023-03-17 ENCOUNTER — Encounter: Payer: Self-pay | Admitting: Physician Assistant

## 2023-03-17 VITALS — BP 132/84 | HR 76 | Temp 97.3°F | Ht 70.0 in | Wt 214.4 lb

## 2023-03-17 DIAGNOSIS — R0981 Nasal congestion: Secondary | ICD-10-CM | POA: Diagnosis not present

## 2023-03-17 DIAGNOSIS — F17200 Nicotine dependence, unspecified, uncomplicated: Secondary | ICD-10-CM

## 2023-03-17 DIAGNOSIS — R499 Unspecified voice and resonance disorder: Secondary | ICD-10-CM | POA: Diagnosis not present

## 2023-03-17 DIAGNOSIS — F902 Attention-deficit hyperactivity disorder, combined type: Secondary | ICD-10-CM

## 2023-03-17 MED ORDER — DOXYCYCLINE HYCLATE 100 MG PO TABS: 100.0000 mg | ORAL_TABLET | Freq: Two times a day (BID) | ORAL | 0 refills | Status: AC

## 2023-03-17 MED ORDER — AMPHETAMINE-DEXTROAMPHET ER 20 MG PO CP24: 20.0000 mg | ORAL_CAPSULE | Freq: Two times a day (BID) | ORAL | 0 refills | Status: DC

## 2023-03-17 MED ORDER — PREDNISONE 20 MG PO TABS: 20.0000 mg | ORAL_TABLET | Freq: Two times a day (BID) | ORAL | 0 refills | Status: AC

## 2023-03-17 NOTE — Assessment & Plan Note (Signed)
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 Suszanne Conners

## 2023-03-17 NOTE — Assessment & Plan Note (Signed)
Recommend cessation. ?

## 2023-03-17 NOTE — Assessment & Plan Note (Signed)
Stable, doing well PDMP reviewed today, no red flags, filling appropriately.  Adderall XR 20 mg BID - refills x 3 months sent today  Adderall 10 mg IR in evenings prn work

## 2023-03-17 NOTE — Progress Notes (Signed)
Subjective:    Patient ID: Jesse Arellano, male    DOB: August 30, 1981, 42 y.o.   MRN: [id number]  Chief Complaint  Patient presents with   Medical Management of Chronic Issues    Pt in the office for 3 mon f/u; pt states everything is going well and will need refills this week. Pt said insurance notified him that there may be changes regarding coverage with medication.     HPI Patient is in today for 3 mo f/up. Staying consistent with medications, no issues or concerns with ADHD, focus has been good.  More hoarseness; definitely worse around pollen season, but also long-term smoker and firefighter. More congested lately as well and starting with sinus pressure again.   Past Medical History:  Diagnosis Date   Anxiety    Asthma    allergy induced in childhood   Depression    Family history of pancreatic cancer    GERD (gastroesophageal reflux disease)    Sleep apnea     Past Surgical History:  Procedure Laterality Date   NASAL SEPTOPLASTY W/ TURBINOPLASTY Bilateral 01/22/2019   Procedure: NASAL SEPTOPLASTY WITH BILATERAL TURBINATE REDUCTION;  Surgeon: Newman Pies, MD;  Location: [facility];  Service: ENT;  Laterality: Bilateral;   WISDOM TOOTH EXTRACTION      Family History  Problem Relation Age of Onset   Diabetes Mother    Cancer Father 72       pancreatic   Diabetes Sister    Cancer Sister 60       thyroid   Kidney cancer Sister 45   Diabetes Sister    Lung cancer Maternal Aunt    Lung cancer Paternal Aunt    Cancer Paternal Uncle        unk type   Lung cancer Maternal Grandfather    Lung cancer Paternal Grandmother    Behavior problems Daughter     Social History   Tobacco Use   Smoking status: Every Day    Packs/day: 1    Types: Cigarettes   Smokeless tobacco: Current  Vaping Use   Vaping Use: Never used  Substance Use Topics   Alcohol use: Yes    Alcohol/week: 5.0 standard drinks of alcohol    Types: 5 Cans of beer per week   Drug  use: No     Allergies  Allergen Reactions   Penicillins     REACTION: hives and itching as child    Review of Systems NEGATIVE UNLESS OTHERWISE INDICATED IN HPI      Objective:     BP 132/84 (BP Location: Left Arm)   Pulse 76   Temp (!) 97.3 F (36.3 C) (Temporal)   Ht 5\' 10"  (1.778 m)   Wt 214 lb 6.4 oz (97.3 kg)   SpO2 97%   BMI 30.76 kg/m   Wt Readings from Last 3 Encounters:  03/17/23 214 lb 6.4 oz (97.3 kg)  11/17/22 211 lb 9.6 oz (96 kg)  08/13/22 210 lb 9.6 oz (95.5 kg)    BP Readings from Last 3 Encounters:  03/17/23 132/84  11/17/22 130/82  08/13/22 118/80     Physical Exam Vitals and nursing note reviewed.  Constitutional:      General: He is not in acute distress.    Appearance: Normal appearance. He is not ill-appearing.  HENT:     Head: Normocephalic.     Right Ear: Tympanic membrane, ear canal and external ear normal.     Left Ear: Tympanic  membrane, ear canal and external ear normal.     Nose: Congestion present.     Mouth/Throat:     Mouth: Mucous membranes are moist.     Pharynx: No oropharyngeal exudate or posterior oropharyngeal erythema.     Comments: Voice noticeably more hoarse  Eyes:     Extraocular Movements: Extraocular movements intact.     Conjunctiva/sclera: Conjunctivae normal.     Pupils: Pupils are equal, round, and reactive to light.  Cardiovascular:     Rate and Rhythm: Normal rate and regular rhythm.     Pulses: Normal pulses.     Heart sounds: Normal heart sounds. No murmur heard. Pulmonary:     Effort: Pulmonary effort is normal. No respiratory distress.     Breath sounds: Normal breath sounds. No wheezing.  Musculoskeletal:     Cervical back: Normal range of motion.  Skin:    General: Skin is warm.  Neurological:     Mental Status: He is alert and oriented to person, place, and time.  Psychiatric:        Mood and Affect: Mood normal.        Behavior: Behavior normal.        Assessment & Plan:  Attention  deficit hyperactivity disorder (ADHD), combined type Assessment & Plan: Stable, doing well PDMP reviewed today, no red flags, filling appropriately.  Adderall XR 20 mg BID - refills x 3 months sent today  Adderall 10 mg IR in evenings prn work   Orders: -     Amphetamine-Dextroamphet ER; Take 1 capsule (20 mg total) by mouth in the morning and at bedtime.  Dispense: 60 capsule; Refill: 0 -     Amphetamine-Dextroamphet ER; Take 1 capsule (20 mg total) by mouth in the morning and at bedtime.  Dispense: 60 capsule; Refill: 0 -     Amphetamine-Dextroamphet ER; Take 1 capsule (20 mg total) by mouth in the morning and at bedtime.  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 Suszanne Conners   Orders: -     Ambulatory referral to ENT  Sinus congestion -     predniSONE; Take 1 tablet (20 mg total) by mouth 2 (two) times daily with a meal for 5 days.  Dispense: 10 tablet; Refill: 0 -     Doxycycline Hyclate; Take 1 tablet (100 mg total) by mouth 2 (two) times daily for 7 days.  Dispense: 14 tablet; Refill: 0       Return in about 3 months (around 06/17/2023) for recheck/follow-up.     Sheridan Gettel M Moo Gravley, PA-C

## 2023-06-03 ENCOUNTER — Encounter: Payer: 59 | Admitting: Physician Assistant

## 2023-06-07 ENCOUNTER — Telehealth: Payer: Self-pay | Admitting: Physician Assistant

## 2023-06-07 NOTE — Telephone Encounter (Signed)
Patient originally had CPE scheduled for 7/19 but due to worldwide outage, he was rescheduled to 7/25. Patient called in today stating that he will be unable to make that day/time due to work. Patient is requesting another time prior to October, which is next availability for CPE with PCP. Please Advise on if we could book over session limits.

## 2023-06-08 NOTE — Telephone Encounter (Signed)
Noted and yes this is ok. Thanks

## 2023-06-09 ENCOUNTER — Encounter: Payer: Self-pay | Admitting: Physician Assistant

## 2023-06-09 ENCOUNTER — Ambulatory Visit (INDEPENDENT_AMBULATORY_CARE_PROVIDER_SITE_OTHER): Payer: 59 | Admitting: Physician Assistant

## 2023-06-09 VITALS — BP 112/72 | HR 77 | Temp 97.3°F | Ht 70.0 in | Wt 215.6 lb

## 2023-06-09 DIAGNOSIS — F902 Attention-deficit hyperactivity disorder, combined type: Secondary | ICD-10-CM | POA: Diagnosis not present

## 2023-06-09 DIAGNOSIS — Z0000 Encounter for general adult medical examination without abnormal findings: Secondary | ICD-10-CM

## 2023-06-09 LAB — COMPREHENSIVE METABOLIC PANEL
ALT: 35 U/L (ref 0–53)
AST: 24 U/L (ref 0–37)
Albumin: 4.7 g/dL (ref 3.5–5.2)
Alkaline Phosphatase: 70 U/L (ref 39–117)
BUN: 14 mg/dL (ref 6–23)
CO2: 25 mEq/L (ref 19–32)
Calcium: 9.9 mg/dL (ref 8.4–10.5)
Chloride: 106 mEq/L (ref 96–112)
Creatinine, Ser: 1.09 mg/dL (ref 0.40–1.50)
GFR: 83.35 mL/min (ref >60.00)
Glucose, Bld: 95 mg/dL (ref 70–99)
Potassium: 4 mEq/L (ref 3.5–5.1)
Sodium: 139 mEq/L (ref 135–145)
Total Bilirubin: 0.7 mg/dL (ref 0.2–1.2)
Total Protein: 7.1 g/dL (ref 6.0–8.3)

## 2023-06-09 LAB — CBC
HCT: 48.3 % (ref 39.0–52.0)
Hemoglobin: 16.1 g/dL (ref 13.0–17.0)
MCHC: 33.3 g/dL (ref 30.0–36.0)
MCV: 91.5 fl (ref 78.0–100.0)
Platelets: 193 10*3/uL (ref 150.0–400.0)
RBC: 5.27 Mil/uL (ref 4.22–5.81)
RDW: 13.9 % (ref 11.5–15.5)
WBC: 7 10*3/uL (ref 4.0–10.5)

## 2023-06-09 LAB — LIPID PANEL
Cholesterol: 169 mg/dL (ref 0–200)
HDL: 37.9 mg/dL — ABNORMAL LOW (ref >39.00)
NonHDL: 131.13
Total CHOL/HDL Ratio: 4
Triglycerides: 213 mg/dL — ABNORMAL HIGH (ref 0.0–149.0)
VLDL: 42.6 mg/dL — ABNORMAL HIGH (ref 0.0–40.0)

## 2023-06-09 LAB — LDL CHOLESTEROL, DIRECT: Direct LDL: 101 mg/dL

## 2023-06-09 LAB — TSH: TSH: 3.13 u[IU]/mL (ref 0.35–5.50)

## 2023-06-09 LAB — HEMOGLOBIN A1C: Hgb A1c MFr Bld: 5.7 % (ref 4.6–6.5)

## 2023-06-09 MED ORDER — AMPHETAMINE-DEXTROAMPHET ER 20 MG PO CP24
20.0000 mg | ORAL_CAPSULE | Freq: Two times a day (BID) | ORAL | 0 refills | Status: DC
Start: 2023-07-09 — End: 2023-12-01

## 2023-06-09 MED ORDER — AMPHETAMINE-DEXTROAMPHET ER 20 MG PO CP24
20.0000 mg | ORAL_CAPSULE | Freq: Two times a day (BID) | ORAL | 0 refills | Status: DC
Start: 2023-08-08 — End: 2023-09-22

## 2023-06-09 MED ORDER — AMPHETAMINE-DEXTROAMPHET ER 20 MG PO CP24
20.0000 mg | ORAL_CAPSULE | Freq: Two times a day (BID) | ORAL | 0 refills | Status: DC
Start: 2023-06-09 — End: 2023-12-01

## 2023-06-09 NOTE — Assessment & Plan Note (Signed)
Stable, doing well PDMP reviewed today, no red flags, filling appropriately.  Adderall XR 20 mg BID - refills x 3 months sent today  Adderall 10 mg IR in evenings prn work

## 2023-06-09 NOTE — Patient Instructions (Addendum)
Always a joy to see you - keep working on lifestyle habits. You need to take care of you in order to care for others!   Call [facility] to set up appointment:  Phone: [phone]

## 2023-06-09 NOTE — Progress Notes (Signed)
Subjective:    Patient ID: Jesse Arellano, male    DOB: [birth year], 42 y.o.   MRN: [id number]  Chief Complaint  Patient presents with   Annual Exam    HPI Patient is in today for annual exam.  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  Substance use: cigarettes  Sexual activity: monogamous  Immunizations: UTD   Past Medical History:  Diagnosis Date   Anxiety    Asthma    allergy induced in childhood   Depression    Family history of pancreatic cancer    GERD (gastroesophageal reflux disease)    Sleep apnea     Past Surgical History:  Procedure Laterality Date   NASAL SEPTOPLASTY W/ TURBINOPLASTY Bilateral 01/22/2019   Procedure: NASAL SEPTOPLASTY WITH BILATERAL TURBINATE REDUCTION;  Surgeon: Newman Pies, MD;  Location: [facility];  Service: ENT;  Laterality: Bilateral;   WISDOM TOOTH EXTRACTION      Family History  Problem Relation Age of Onset   Diabetes Mother    Cancer Father 66       pancreatic   Diabetes Sister    Cancer Sister 63       thyroid   Kidney cancer Sister 37   Diabetes Sister    Lung cancer Maternal Aunt    Lung cancer Paternal Aunt    Cancer Paternal Uncle        unk type   Lung cancer Maternal Grandfather    Lung cancer Paternal Grandmother    Behavior problems Daughter     Social History   Tobacco Use   Smoking status: Every Day    Current packs/day: 1.00    Types: Cigarettes   Smokeless tobacco: Current  Vaping Use   Vaping status: Never Used  Substance Use Topics   Alcohol use: Yes    Alcohol/week: 5.0  standard drinks of alcohol    Types: 5 Cans of beer per week   Drug use: No     Allergies  Allergen Reactions   Penicillins     REACTION: hives and itching as child    Review of Systems NEGATIVE UNLESS OTHERWISE INDICATED IN HPI      Objective:     BP 112/72   Pulse 77   Temp (!) 97.3 F (36.3 C) (Temporal)   Ht 5\' 10"  (1.778 m)   Wt 215 lb 9.6 oz (97.8 kg)   SpO2 96%   BMI 30.94 kg/m   Wt Readings from Last 3 Encounters:  06/09/23 215 lb 9.6 oz (97.8 kg)  03/17/23 214 lb 6.4 oz (97.3 kg)  11/17/22 211 lb 9.6 oz (96 kg)    BP Readings from Last 3 Encounters:  06/09/23 112/72  03/17/23 132/84  11/17/22 130/82     Physical Exam Vitals and nursing note reviewed.  Constitutional:      General: He is not in acute distress.    Appearance: Normal appearance. He is not toxic-appearing.  HENT:     Head: Normocephalic and atraumatic.     Right Ear: Tympanic membrane, ear canal and external ear normal.  Left Ear: Tympanic membrane, ear canal and external ear normal.     Nose: Nose normal.     Mouth/Throat:     Mouth: Mucous membranes are moist.     Pharynx: Oropharynx is clear.  Eyes:     Extraocular Movements: Extraocular movements intact.     Conjunctiva/sclera: Conjunctivae normal.     Pupils: Pupils are equal, round, and reactive to light.  Cardiovascular:     Rate and Rhythm: Normal rate and regular rhythm.     Pulses: Normal pulses.     Heart sounds: Normal heart sounds.  Pulmonary:     Effort: Pulmonary effort is normal.     Breath sounds: Normal breath sounds.  Musculoskeletal:        General: Normal range of motion.     Cervical back: Normal range of motion and neck supple.     Right lower leg: No edema.     Left lower leg: No edema.  Skin:    General: Skin is warm and dry.     Findings: No lesion.  Neurological:     General: No focal deficit present.     Mental Status: He is alert and oriented to person, place, and time.  Psychiatric:         Mood and Affect: Mood normal.        Behavior: Behavior normal.        Assessment & Plan:  Routine medical exam -     CBC -     Comprehensive metabolic panel -     Hemoglobin A1c -     TSH -     Lipid panel  Attention deficit hyperactivity disorder (ADHD), combined type Assessment & Plan: Stable, doing well PDMP reviewed today, no red flags, filling appropriately.  Adderall XR 20 mg BID - refills x 3 months sent today  Adderall 10 mg IR in evenings prn work   Orders: -     Amphetamine-Dextroamphet ER; Take 1 capsule (20 mg total) by mouth in the morning and at bedtime.  Dispense: 60 capsule; Refill: 0 -     Amphetamine-Dextroamphet ER; Take 1 capsule (20 mg total) by mouth in the morning and at bedtime.  Dispense: 60 capsule; Refill: 0 -     Amphetamine-Dextroamphet ER; Take 1 capsule (20 mg total) by mouth in the morning and at bedtime.  Dispense: 60 capsule; Refill: 0   Age-appropriate screening and counseling performed today. Will check labs and call with results. Preventive measures discussed and printed in AVS for patient.   Patient Counseling: [x]   Nutrition: Stressed importance of moderation in sodium/caffeine intake, saturated fat and cholesterol, caloric balance, sufficient intake of fresh fruits, vegetables, and fiber.  [x]   Stressed the importance of regular exercise.   [x]   Substance Abuse: Discussed cessation/primary prevention of tobacco, alcohol, or other drug use; driving or other dangerous activities under the influence; availability of treatment for abuse.   [x]   Injury prevention: Discussed safety belts, safety helmets, smoke detector, smoking near bedding or upholstery.   []   Sexuality: Discussed sexually transmitted diseases, partner selection, use of condoms, avoidance of unintended pregnancy  and contraceptive alternatives.   [x]   Dental health: Discussed importance of regular tooth brushing, flossing, and dental visits.  [x]   Health maintenance and  immunizations reviewed. Please refer to Health maintenance section.      Return in about 3 months (around 09/09/2023) for recheck/follow-up.   Daschel Roughton M Carmellia Kreisler, PA-C

## 2023-09-22 ENCOUNTER — Ambulatory Visit: Payer: 59 | Admitting: Physician Assistant

## 2023-09-22 ENCOUNTER — Encounter: Payer: Self-pay | Admitting: Physician Assistant

## 2023-09-22 VITALS — BP 122/84 | HR 73 | Temp 97.8°F | Ht 70.0 in | Wt 213.8 lb

## 2023-09-22 DIAGNOSIS — M79675 Pain in left toe(s): Secondary | ICD-10-CM

## 2023-09-22 DIAGNOSIS — F902 Attention-deficit hyperactivity disorder, combined type: Secondary | ICD-10-CM

## 2023-09-22 DIAGNOSIS — R682 Dry mouth, unspecified: Secondary | ICD-10-CM

## 2023-09-22 DIAGNOSIS — K219 Gastro-esophageal reflux disease without esophagitis: Secondary | ICD-10-CM

## 2023-09-22 DIAGNOSIS — R1310 Dysphagia, unspecified: Secondary | ICD-10-CM

## 2023-09-22 DIAGNOSIS — F17200 Nicotine dependence, unspecified, uncomplicated: Secondary | ICD-10-CM

## 2023-09-22 DIAGNOSIS — R499 Unspecified voice and resonance disorder: Secondary | ICD-10-CM

## 2023-09-22 MED ORDER — AMPHETAMINE-DEXTROAMPHET ER 20 MG PO CP24
20.0000 mg | ORAL_CAPSULE | Freq: Two times a day (BID) | ORAL | 0 refills | Status: DC
Start: 2023-09-22 — End: 2024-06-29

## 2023-09-22 MED ORDER — AMPHETAMINE-DEXTROAMPHET ER 20 MG PO CP24
20.0000 mg | ORAL_CAPSULE | Freq: Two times a day (BID) | ORAL | 0 refills | Status: DC
Start: 2023-11-21 — End: 2023-12-21

## 2023-09-22 MED ORDER — PANTOPRAZOLE SODIUM 20 MG PO TBEC: 20.0000 mg | DELAYED_RELEASE_TABLET | Freq: Every day | ORAL | 2 refills | Status: DC

## 2023-09-22 MED ORDER — NYSTATIN 100000 UNIT/ML MT SUSP: 5.0000 mL | Freq: Four times a day (QID) | OROMUCOSAL | 1 refills | Status: DC

## 2023-09-22 MED ORDER — AMPHETAMINE-DEXTROAMPHET ER 20 MG PO CP24
20.0000 mg | ORAL_CAPSULE | Freq: Two times a day (BID) | ORAL | 0 refills | Status: DC
Start: 2023-10-22 — End: 2024-03-29

## 2023-09-22 NOTE — Assessment & Plan Note (Signed)
Stable, doing well PDMP reviewed today, no red flags, filling appropriately.  Adderall XR 20 mg BID - refills x 3 months sent today  Adderall 10 mg IR in evenings prn work

## 2023-09-22 NOTE — Patient Instructions (Addendum)
[facility] - BASEMENT LEVEL - WALK IN  [address] [place], [place] [address] Tel: [phone] Hours: M-F 8:30 am - 5:00 pm Closed for lunch 12:30 pm - 1:00 pm   Please call [facility] to schedule: [phone]  Try Biotene mouth rinse / melts  Nystatin swish & swallow  Protonix for severe GERD   Work on smoking cessation  Increase water intake at least 80-100 oz daily   Referral to ENT for hoarseness

## 2023-09-22 NOTE — Progress Notes (Signed)
Subjective:    Patient ID: Jesse Arellano, male    DOB: January 05, 1981, 42 y.o.   MRN: [id number]  Chief Complaint  Patient presents with   Depression   Anxiety    Refill needed on Adderall 20mg      Asthma   Gastroesophageal Reflux    HPI Discussed the use of AI scribe software for clinical note transcription with the patient, who gave verbal consent to proceed.  History of Present Illness   The patient, with a history of asthma, allergies, and sinus surgery, presents with multiple concerns. The primary concern is a severe dry mouth, which has been progressively worsening. The patient reports difficulty swallowing, particularly when taking medications, and has noticed an increase in hoarseness. The patient also mentions a persistent issue with a previously injured toe, which had initially improved but has recently become sore again. The patient has a history of acid reflux, which has been poorly controlled recently, and has been non-adherent with medication. The patient is a smoker and has recently had labs done, which showed slightly elevated cholesterol levels.       Past Medical History:  Diagnosis Date   Anxiety    Asthma    allergy induced in childhood   Depression    Family history of pancreatic cancer    GERD (gastroesophageal reflux disease)    Sleep apnea     Past Surgical History:  Procedure Laterality Date   NASAL SEPTOPLASTY W/ TURBINOPLASTY Bilateral 01/22/2019   Procedure: NASAL SEPTOPLASTY WITH BILATERAL TURBINATE REDUCTION;  Surgeon: Newman Pies, MD;  Location: [facility];  Service: ENT;  Laterality: Bilateral;   WISDOM TOOTH EXTRACTION      Family History  Problem Relation Age of Onset   Diabetes Mother    Cancer Father 67       pancreatic   Diabetes Sister    Cancer Sister 51       thyroid   Kidney cancer Sister 36   Diabetes Sister    Lung cancer Maternal Aunt    Lung cancer Paternal Aunt    Cancer Paternal Uncle        unk type    Lung cancer Maternal Grandfather    Lung cancer Paternal Grandmother    Behavior problems Daughter     Social History   Tobacco Use   Smoking status: Every Day    Current packs/day: 1.00    Types: Cigarettes   Smokeless tobacco: Current  Vaping Use   Vaping status: Never Used  Substance Use Topics   Alcohol use: Yes    Alcohol/week: 5.0 standard drinks of alcohol    Types: 5 Cans of beer per week   Drug use: No     Allergies  Allergen Reactions   Penicillins     REACTION: hives and itching as child    Review of Systems NEGATIVE UNLESS OTHERWISE INDICATED IN HPI      Objective:     BP 122/84   Pulse 73   Temp 97.8 F (36.6 C)   Ht 5\' 10"  (1.778 m)   Wt 213 lb 12.8 oz (97 kg)   SpO2 97%   BMI 30.68 kg/m   Wt Readings from Last 3 Encounters:  09/22/23 213 lb 12.8 oz (97 kg)  06/09/23 215 lb 9.6 oz (97.8 kg)  03/17/23 214 lb 6.4 oz (97.3 kg)    BP Readings from Last 3 Encounters:  09/22/23 122/84  06/09/23 112/72  03/17/23 132/84  Physical Exam Vitals and nursing note reviewed.  Constitutional:      General: He is not in acute distress.    Appearance: Normal appearance. He is not ill-appearing.  HENT:     Head: Normocephalic.     Right Ear: Tympanic membrane, ear canal and external ear normal.     Left Ear: Tympanic membrane, ear canal and external ear normal.     Nose: No congestion.     Mouth/Throat:     Mouth: Mucous membranes are moist.     Pharynx: No oropharyngeal exudate or posterior oropharyngeal erythema.     Comments: Voice noticeably more hoarse  White patches noted on uvula Eyes:     Extraocular Movements: Extraocular movements intact.     Conjunctiva/sclera: Conjunctivae normal.     Pupils: Pupils are equal, round, and reactive to light.  Cardiovascular:     Rate and Rhythm: Normal rate and regular rhythm.     Pulses: Normal pulses.     Heart sounds: Normal heart sounds. No murmur heard. Pulmonary:     Effort: Pulmonary  effort is normal. No respiratory distress.     Breath sounds: Normal breath sounds. No wheezing.  Musculoskeletal:     Cervical back: Normal range of motion.  Skin:    General: Skin is warm.  Neurological:     Mental Status: He is alert and oriented to person, place, and time.  Psychiatric:        Mood and Affect: Mood normal.        Behavior: Behavior normal.        Assessment & Plan:  Attention deficit hyperactivity disorder (ADHD), combined type Assessment & Plan: Stable, doing well PDMP reviewed today, no red flags, filling appropriately.  Adderall XR 20 mg BID - refills x 3 months sent today  Adderall 10 mg IR in evenings prn work   Orders: -     Amphetamine-Dextroamphet ER; Take 1 capsule (20 mg total) by mouth in the morning and at bedtime.  Dispense: 60 capsule; Refill: 0 -     Amphetamine-Dextroamphet ER; Take 1 capsule (20 mg total) by mouth in the morning and at bedtime.  Dispense: 60 capsule; Refill: 0 -     Amphetamine-Dextroamphet ER; Take 1 capsule (20 mg total) by mouth in the morning and at bedtime.  Dispense: 60 capsule; Refill: 0  Great toe pain, left -     DG Toe Great Left; Future  Gastroesophageal reflux disease without esophagitis -     Pantoprazole Sodium; Take 1 tablet (20 mg total) by mouth daily. Take on an empty stomach.  Dispense: 30 tablet; Refill: 2 -     Ambulatory referral to Gastroenterology  Dysphagia, unspecified type -     Nystatin; Take 5 mLs (500,000 Units total) by mouth 4 (four) times daily. Swish and swallow.  Dispense: 120 mL; Refill: 1 -     Ambulatory referral to Gastroenterology -     Ambulatory referral to ENT  Dry mouth  Hoarseness or changing voice -     Ambulatory referral to ENT   Assessment and Plan     Dry Mouth Patient reports severe dry mouth, possibly related to allergy medication and mouth breathing. -Start Biotene mouth rinse and melts for symptomatic relief. -Start Nystatin swish and swallow in case of  oral thrush.  Gastroesophageal Reflux Disease (GERD) Patient reports occasional severe acid reflux related to certain foods and drinks. -Start Protonix as needed for severe flare-ups. -Consider referral to Gastroenterology for  further evaluation to rule out Barrett's esophagus.  Foot Injury Patient reports persistent tenderness in left big [place] -Order X-ray of left foot to rule out foreign body.   General Health Maintenance -Encourage smoking cessation and increased water intake (80-100 ounces daily). -Consider referral to ENT for hoarseness and difficulty swallowing. -Schedule 9-month follow-up appointment.          Return in about 3 months (around 12/23/2023) for recheck/follow-up.    Jerik Falletta M Westley Blass, PA-C

## 2023-09-27 ENCOUNTER — Encounter (INDEPENDENT_AMBULATORY_CARE_PROVIDER_SITE_OTHER): Payer: Self-pay | Admitting: Otolaryngology

## 2023-10-10 ENCOUNTER — Encounter: Payer: Self-pay | Admitting: Physician Assistant

## 2023-10-10 ENCOUNTER — Ambulatory Visit
Admission: RE | Admit: 2023-10-10 | Discharge: 2023-10-10 | Disposition: A | Discharge status: Home / Self Care | Payer: 59 | Source: Ambulatory Visit | Attending: Physician Assistant

## 2023-10-10 DIAGNOSIS — M79675 Pain in left toe(s): Secondary | ICD-10-CM

## 2023-10-10 NOTE — Telephone Encounter (Signed)
Please see pt message, Xray has been completed

## 2023-10-30 ENCOUNTER — Encounter: Payer: Self-pay | Admitting: Physician Assistant

## 2023-10-31 ENCOUNTER — Other Ambulatory Visit: Payer: Self-pay

## 2023-10-31 DIAGNOSIS — M79675 Pain in left toe(s): Secondary | ICD-10-CM

## 2023-11-15 ENCOUNTER — Ambulatory Visit: Payer: 59 | Admitting: Podiatry

## 2023-11-15 ENCOUNTER — Ambulatory Visit: Payer: 59

## 2023-11-15 ENCOUNTER — Encounter: Payer: Self-pay | Admitting: Podiatry

## 2023-11-15 DIAGNOSIS — M778 Other enthesopathies, not elsewhere classified: Secondary | ICD-10-CM

## 2023-11-15 DIAGNOSIS — S90852A Superficial foreign body, left foot, initial encounter: Secondary | ICD-10-CM

## 2023-11-15 NOTE — Patient Instructions (Signed)

## 2023-11-15 NOTE — Progress Notes (Signed)
 Subjective:   Patient ID: Jesse Arellano, male   DOB: 42 y.o.   MRN: [id number]   HPI Chief Complaint  Patient presents with   Toe Pain    RM#14 Patient states back in June [place] and has something stuck in his big toe. Didn't see a provider when injury occurred.   42 year old male presents the office with above concerns.  [place] because the bottom of his foot.  Since he developed a callus and feels almost like a splinter in the bottom of the foot.  Hurts with pressure.  No swelling redness or drainage.   Review of Systems  All other systems reviewed and are negative.  Past Medical History:  Diagnosis Date   Anxiety    Asthma    allergy induced in childhood   Depression    Family history of pancreatic cancer    GERD (gastroesophageal reflux disease)    Sleep apnea     Past Surgical History:  Procedure Laterality Date   NASAL SEPTOPLASTY W/ TURBINOPLASTY Bilateral 01/22/2019   Procedure: NASAL SEPTOPLASTY WITH BILATERAL TURBINATE REDUCTION;  Surgeon: Karis Clunes, MD;  Location: [facility];  Service: ENT;  Laterality: Bilateral;   WISDOM TOOTH EXTRACTION       Current Outpatient Medications:    [START ON 11/21/2023] amphetamine -dextroamphetamine  (ADDERALL XR) 20 MG 24 hr capsule, Take 1 capsule (20 mg total) by mouth in the morning and at bedtime., Disp: 60 capsule, Rfl: 0   amphetamine -dextroamphetamine  (ADDERALL XR) 20 MG 24 hr capsule, Take 1 capsule (20 mg total) by mouth in the morning and at bedtime., Disp: 60 capsule, Rfl: 0   amphetamine -dextroamphetamine  (ADDERALL) 10 MG tablet, Take 1 tablet (10 mg total) by mouth at bedtime as needed., Disp: 30 tablet, Rfl: 0   atorvastatin  (LIPITOR) 20 MG tablet, Take 1 tablet (20 mg total) by mouth daily., Disp: 90 tablet, Rfl: 1   nystatin  (MYCOSTATIN ) 100000 UNIT/ML suspension, Take 5 mLs (500,000 Units total) by mouth 4 (four) times daily. Swish and swallow., Disp:  120 mL, Rfl: 1   pantoprazole  (PROTONIX ) 20 MG tablet, Take 1 tablet (20 mg total) by mouth daily. Take on an empty stomach., Disp: 30 tablet, Rfl: 2   amphetamine -dextroamphetamine  (ADDERALL XR) 20 MG 24 hr capsule, Take 1 capsule (20 mg total) by mouth 2 (two) times daily., Disp: 60 capsule, Rfl: 0   amphetamine -dextroamphetamine  (ADDERALL XR) 20 MG 24 hr capsule, Take 1 capsule (20 mg total) by mouth in the morning and at bedtime., Disp: 60 capsule, Rfl: 0   amphetamine -dextroamphetamine  (ADDERALL XR) 20 MG 24 hr capsule, Take 1 capsule (20 mg total) by mouth in the morning and at bedtime., Disp: 60 capsule, Rfl: 0   amphetamine -dextroamphetamine  (ADDERALL XR) 20 MG 24 hr capsule, Take 1 capsule (20 mg total) by mouth in the morning and at bedtime., Disp: 60 capsule, Rfl: 0   amphetamine -dextroamphetamine  (ADDERALL XR) 20 MG 24 hr capsule, Take 1 capsule (20 mg total) by mouth in the morning and at bedtime., Disp: 60 capsule, Rfl: 0   amphetamine -dextroamphetamine  (ADDERALL XR) 20 MG 24 hr capsule, Take 1 capsule (20 mg total) by mouth in the morning and at bedtime., Disp: 60 capsule, Rfl: 0  Allergies  Allergen Reactions   Penicillins     REACTION: hives and itching as child           Objective:  Physical Exam  General: AAO  x3, NAD  Dermatological: Plantar aspect of the left hallux with a hyperkeratotic lesion.  There is a central deeper, annular area.  Once I debrided this there was no obvious foreign body identified I did not apply to the callus tissue.  There is no edema, erythema, drainage or pus.  Vascular: Dorsalis Pedis artery and Posterior Tibial artery pedal pulses are 2/4 bilateral with immedate capillary fill time.  There is no pain with calf compression, swelling, warmth, erythema.   Neruologic: Grossly intact via light touch bilateral.    Musculoskeletal: Tenderness with skin lesion.  No other areas of discomfort. Gait: Unassisted, Nonantalgic.       Assessment:    Skin lesion, foreign body     Plan:  -Treatment options discussed including all alternatives, risks, and complications -Etiology of symptoms were discussed -Independent reviewed x-rays that he had been previously.  No evidence of acute fracture.  Soft tissue changes noted. -We discussed the conservative as well as surgical options.  Sharply debrided hyperkeratotic tissue without any complications or bleeding today.  Recommended Epsom salt soaks daily as well as embolic ointment to the area daily.  If symptoms persist we will need to do excise the lesion.  Will likely do this under local anesthesia in the office. -Monitor for any clinical signs or symptoms of infection and directed to call the office immediately should any occur or go to the ER.  Donnice JONELLE Fees DPM   Scheduled for possible I&D

## 2023-12-01 ENCOUNTER — Other Ambulatory Visit: Payer: Self-pay

## 2023-12-01 ENCOUNTER — Other Ambulatory Visit: Payer: Self-pay | Admitting: Physician Assistant

## 2023-12-21 ENCOUNTER — Ambulatory Visit: Payer: 59 | Admitting: Physician Assistant

## 2023-12-21 ENCOUNTER — Encounter: Payer: Self-pay | Admitting: Physician Assistant

## 2023-12-21 VITALS — BP 118/68 | HR 79 | Temp 98.4°F | Ht 70.0 in | Wt 214.6 lb

## 2023-12-21 DIAGNOSIS — F17200 Nicotine dependence, unspecified, uncomplicated: Secondary | ICD-10-CM

## 2023-12-21 DIAGNOSIS — F902 Attention-deficit hyperactivity disorder, combined type: Secondary | ICD-10-CM

## 2023-12-21 DIAGNOSIS — K219 Gastro-esophageal reflux disease without esophagitis: Secondary | ICD-10-CM | POA: Diagnosis not present

## 2023-12-21 DIAGNOSIS — R499 Unspecified voice and resonance disorder: Secondary | ICD-10-CM | POA: Diagnosis not present

## 2023-12-21 DIAGNOSIS — E782 Mixed hyperlipidemia: Secondary | ICD-10-CM

## 2023-12-21 MED ORDER — AMPHETAMINE-DEXTROAMPHET ER 20 MG PO CP24: 20.0000 mg | ORAL_CAPSULE | Freq: Two times a day (BID) | ORAL | 0 refills | Status: DC

## 2023-12-21 NOTE — Patient Instructions (Signed)
 Good morning Havish,   Our office received a referral from Jeanette Moffatt, PA-C to schedule an appointment. If you will give our office a call at your convenience to discuss scheduling at  [phone] option 1   Thank you, [facility]

## 2023-12-21 NOTE — Progress Notes (Signed)
 Patient ID: Jesse Arellano, male    DOB: [birth year], 43 y.o.   MRN: [id number]   Assessment & Plan:  Gastroesophageal reflux disease without esophagitis  Attention deficit hyperactivity disorder (ADHD), combined type -     Amphetamine -Dextroamphet ER; Take 1 capsule (20 mg total) by mouth in the morning and at bedtime.  Dispense: 60 capsule; Refill: 0  Hoarseness or changing voice  Smoker  Mixed hyperlipidemia    Assessment and Plan    Foreign Body in Foot Successfully removed by podiatrist with resolution of symptoms. -No further action required.  ADHD Stable on Adderall XR 20mg  twice daily, though second dose not always taken. -Continue Adderall 20mg  twice daily. -Refill prescription. -PDMP reviewed today, no red flags, filling appropriately.    Hyperlipidemia On Lipitor 20 mg, patient expressed desire to discontinue due to personal concerns. Stable. Monitor levels. Keep working on dietary changes.   GERD Improvement noted with medication (Protonix  20 mg), patient considering GI consult. -Continue current medication. -Encourage GI consult.  Tobacco Use Patient and wife both smoking, wife has allergy to tobacco leaf. -Keep working on smoking cessation  Routine Health Maintenance -Encourage patient to schedule eye exam. -Follow-up in 3 months.          Return in about 3 months (around 03/19/2024) for recheck/follow-up.    Subjective:    Chief Complaint  Patient presents with   ADHD    Pt in office for medication refill and ADHD follow up    HPI Discussed the use of AI scribe software for clinical note transcription with the patient, who gave verbal consent to proceed.  History of Present Illness   The patient, with a history of ADHD, high cholesterol, and acid reflux, presents for a routine follow-up. He reports a recent visit to a podiatrist for foot pain, which was successfully treated with in-office removal of a foreign body. The patient  notes significant improvement in foot pain since the procedure.  The patient also discusses ongoing issues with acid reflux, which has improved with medication, but expresses a desire to manage this condition through dietary changes rather than long-term medication use. He also mentions a recent increase in cholesterol levels and is currently on Lipitor for management. However, the patient expresses a desire to improve this condition through lifestyle changes to reduce medication intake.  The patient also reports a consistent intake of Adderall for ADHD management, but notes a desire to become more aware of his condition and manage it without medication where possible. He also mentions a high smoking rate of almost two packs a day, acknowledging the need for reduction or cessation.  Lastly, the patient mentions a need for an eye exam due to perceived worsening of vision, and plans to schedule a GI appointment to address the ongoing acid reflux issues.       Past Medical History:  Diagnosis Date   Anxiety    Asthma    allergy induced in childhood   Depression    Family history of pancreatic cancer    GERD (gastroesophageal reflux disease)    Sleep apnea     Past Surgical History:  Procedure Laterality Date   NASAL SEPTOPLASTY W/ TURBINOPLASTY Bilateral 01/22/2019   Procedure: NASAL SEPTOPLASTY WITH BILATERAL TURBINATE REDUCTION;  Surgeon: Karis Clunes, MD;  Location: [facility];  Service: ENT;  Laterality: Bilateral;   WISDOM TOOTH EXTRACTION      Family History  Problem Relation Age of Onset   Diabetes Mother  Cancer Father 32       pancreatic   Diabetes Sister    Cancer Sister 63       thyroid    Kidney cancer Sister 31   Diabetes Sister    Lung cancer Maternal Aunt    Lung cancer Paternal Aunt    Cancer Paternal Uncle        unk type   Lung cancer Maternal Grandfather    Lung cancer Paternal Grandmother    Behavior problems Daughter     Social History    Tobacco Use   Smoking status: Every Day    Current packs/day: 1.00    Types: Cigarettes   Smokeless tobacco: Current  Vaping Use   Vaping status: Never Used  Substance Use Topics   Alcohol use: Yes    Alcohol/week: 5.0 standard drinks of alcohol    Types: 5 Cans of beer per week   Drug use: No     Allergies  Allergen Reactions   Penicillins     REACTION: hives and itching as child    Review of Systems NEGATIVE UNLESS OTHERWISE INDICATED IN HPI      Objective:     BP 118/68 (BP Location: Left Arm, Patient Position: Sitting, Cuff Size: Normal)   Pulse 79   Temp 98.4 F (36.9 C) (Temporal)   Ht 5' 10 (1.778 m)   Wt 214 lb 9.6 oz (97.3 kg)   SpO2 96%   BMI 30.79 kg/m   Wt Readings from Last 3 Encounters:  12/21/23 214 lb 9.6 oz (97.3 kg)  09/22/23 213 lb 12.8 oz (97 kg)  06/09/23 215 lb 9.6 oz (97.8 kg)    BP Readings from Last 3 Encounters:  12/21/23 118/68  09/22/23 122/84  06/09/23 112/72     Physical Exam Vitals and nursing note reviewed.  Constitutional:      Appearance: Normal appearance.  Eyes:     Extraocular Movements: Extraocular movements intact.     Conjunctiva/sclera: Conjunctivae normal.     Pupils: Pupils are equal, round, and reactive to light.  Cardiovascular:     Rate and Rhythm: Normal rate and regular rhythm.  Pulmonary:     Effort: Pulmonary effort is normal.     Breath sounds: Normal breath sounds.  Neurological:     General: No focal deficit present.     Mental Status: He is alert and oriented to person, place, and time.  Psychiatric:        Mood and Affect: Mood normal.        Behavior: Behavior normal.         Conrad Zajkowski M Tymika Grilli, PA-C

## 2024-03-26 ENCOUNTER — Ambulatory Visit: Admitting: Physician Assistant

## 2024-03-29 ENCOUNTER — Ambulatory Visit: Admitting: Physician Assistant

## 2024-03-29 ENCOUNTER — Encounter: Payer: Self-pay | Admitting: Physician Assistant

## 2024-03-29 VITALS — BP 102/66 | HR 80 | Temp 98.2°F | Ht 70.0 in | Wt 215.6 lb

## 2024-03-29 DIAGNOSIS — F902 Attention-deficit hyperactivity disorder, combined type: Secondary | ICD-10-CM

## 2024-03-29 DIAGNOSIS — E782 Mixed hyperlipidemia: Secondary | ICD-10-CM | POA: Diagnosis not present

## 2024-03-29 DIAGNOSIS — K219 Gastro-esophageal reflux disease without esophagitis: Secondary | ICD-10-CM | POA: Diagnosis not present

## 2024-03-29 DIAGNOSIS — M722 Plantar fascial fibromatosis: Secondary | ICD-10-CM

## 2024-03-29 MED ORDER — PANTOPRAZOLE SODIUM 20 MG PO TBEC: 20.0000 mg | DELAYED_RELEASE_TABLET | Freq: Every day | ORAL | 2 refills | Status: AC

## 2024-03-29 MED ORDER — ATORVASTATIN CALCIUM 20 MG PO TABS: 20.0000 mg | ORAL_TABLET | Freq: Every day | ORAL | 1 refills | Status: AC

## 2024-03-29 MED ORDER — AMPHETAMINE-DEXTROAMPHET ER 20 MG PO CP24: 20.0000 mg | ORAL_CAPSULE | Freq: Two times a day (BID) | ORAL | 0 refills | Status: DC

## 2024-03-29 NOTE — Patient Instructions (Signed)
  VISIT SUMMARY: Today, we discussed your medication refills and management of various symptoms, including plantar fasciitis, hyperlipidemia, acid reflux, and ADHD. We also addressed your concerns about vision changes and potential side effects of your statin medication.  YOUR PLAN: PLANTAR FASCIITIS: You have chronic pain in your right foot, especially in the mornings and after standing or walking for long periods. -Use a frozen water bottle to roll and stretch your foot. -Wear better footwear with appropriate support. -Consider seeing a podiatrist if your symptoms persist or worsen.  HYPERLIPIDEMIA: You have high cholesterol managed with statins, and you are concerned about potential side effects, including vision changes and muscle health. -Refill for Lipitor 20 mg has been sent. -Start taking a CoQ10 supplement, 100-240 mg daily.  ACID REFLUX: You have acid reflux managed with pantoprazole , but you have not taken it for a few weeks without significant issues. -Refill for Protonix  20 mg daily has been sent. -Monitor your symptoms and use the medication as needed.  ADHD: Your ADHD is managed with Adderall XR 20 mg twice a day, and your symptoms are well-controlled with this regimen. -Refill for Adderall XR 20 mg has been sent.                      Contains text generated by [employer]bridge.                                 Contains text generated by [employer]bridge.

## 2024-03-29 NOTE — Progress Notes (Signed)
 Patient ID: Jesse Arellano, male    DOB: 22-Jan-1981, 43 y.o.   MRN: [id number]   Assessment & Plan:  Attention deficit hyperactivity disorder (ADHD), combined type -     Amphetamine -Dextroamphet ER; Take 1 capsule (20 mg total) by mouth in the morning and at bedtime.  Dispense: 60 capsule; Refill: 0  Plantar fasciitis of right foot  Gastroesophageal reflux disease without esophagitis -     Pantoprazole  Sodium; Take 1 tablet (20 mg total) by mouth daily. Take on an empty stomach.  Dispense: 30 tablet; Refill: 2  Mixed hyperlipidemia  Other orders -     Atorvastatin  Calcium ; Take 1 tablet (20 mg total) by mouth daily.  Dispense: 90 tablet; Refill: 1     Assessment & Plan Plantar fasciitis Chronic plantar fasciitis primarily affecting the right foot, with classic symptoms of morning pain and soreness throughout the day, exacerbated by prolonged standing and inadequate footwear. Differential diagnosis includes anterior tibial tendonitis, but plantar fasciitis is more likely given the symptom pattern. He is aware of the need for rest, ice, and proper footwear but has not yet implemented these changes. - Advise use of frozen water bottles for rolling and stretching the foot. - Recommend better footwear with appropriate support. - Consider referral to a podiatrist if symptoms persist or worsen.  Hyperlipidemia Long-standing hyperlipidemia managed with statins. Concerns about potential side effects of statins, including CoQ10 depletion and its impact on vision and muscle health. Discussion about the benefits of statins in reducing heart attack and stroke risk versus potential side effects. He is considering dietary and exercise modifications to manage hyperlipidemia. CoQ10 supplementation discussed as a potential mitigation for statin side effects. - Send refill for Lipitor 20 mg. - Recommend starting CoQ10 supplement 100-240 mg daily.  Acid reflux Acid reflux managed with  pantoprazole . He has not taken pantoprazole  for a few weeks without significant issues. Discussion about potential dietary enzyme deficiency and alternative management strategies, such as apple cider vinegar. He prefers to minimize medication use and focus on lifestyle modifications. - Send refill for Protonix  20 mg daily. - Advise him to monitor symptoms and use medication as needed.  ADHD ADHD managed with Adderall XR 20 mg twice a day. He reports well-managed symptoms with current regimen but has had some days requiring twice daily dosing due to increased demands. - Send refill for Adderall XR 20 mg.      Return in about 3 months (around 06/29/2024) for physical, recheck/follow-up, fasting labs .    Subjective:    Chief Complaint  Patient presents with   ADHD    Pt in office for 3 mon f/u and medication refills; severe pain right heel and happens in left as well, thinking it may be plantar fascitis;       HPI Discussed the use of AI scribe software for clinical note transcription with the patient, who gave verbal consent to proceed.  History of Present Illness Jesse Arellano is a 43 year old male who presents for medication refills and management of symptoms.  He has not been taking pantoprazole  for acid reflux for a couple of weeks due to a misunderstanding about his prescription refills. Despite this, he has not experienced significant acid reflux symptoms during this period. He has discussed dietary enzymes and potential alternative treatments with his chiropractor but has not pursued these options due to his busy schedule.  He experiences postnasal drip and sinus issues, which he attributes to not taking his allergy medication  for two days and being fatigued from lack of sleep. He describes his voice as 'rough' due to these factors.  He is concerned about his vision worsening and questions whether his statin medication could be affecting his CoQ10 levels, potentially  contributing to his symptoms. He has been on the statin for over a year and acknowledges the importance of diet and exercise in managing his condition, although he finds it challenging to maintain these due to his schedule.  He reports symptoms of plantar fasciitis, particularly in his right foot, with pain being most severe in the mornings and after prolonged periods of standing or walking. He manages the pain with rest, ice, and elevation, but is concerned about the long-term impact on his mobility given his active lifestyle and work demands.  He is currently taking Adderall XR 20 mg twice a day for ADHD, Lipitor 20 mg for cholesterol management, and has a prescription for Protonix  20 mg for acid reflux, which he has not been using recently. He is a Immunologist, working shifts and teaching at [employer], which contributes to his fatigue and impacts his ability to manage his health conditions effectively.     Past Medical History:  Diagnosis Date   Anxiety    Asthma    allergy induced in childhood   Depression    Family history of pancreatic cancer    GERD (gastroesophageal reflux disease)    Sleep apnea     Past Surgical History:  Procedure Laterality Date   NASAL SEPTOPLASTY W/ TURBINOPLASTY Bilateral 01/22/2019   Procedure: NASAL SEPTOPLASTY WITH BILATERAL TURBINATE REDUCTION;  Surgeon: Reynold Caves, MD;  Location: [facility];  Service: ENT;  Laterality: Bilateral;   WISDOM TOOTH EXTRACTION      Family History  Problem Relation Age of Onset   Diabetes Mother    Cancer Father 57       pancreatic   Diabetes Sister    Cancer Sister 34       thyroid    Kidney cancer Sister 33   Diabetes Sister    Lung cancer Maternal Aunt    Lung cancer Paternal Aunt    Cancer Paternal Uncle        unk type   Lung cancer Maternal Grandfather    Lung cancer Paternal Grandmother    Behavior problems Daughter     Social History   Tobacco Use   Smoking status: Every Day     Current packs/day: 1.00    Types: Cigarettes   Smokeless tobacco: Current  Vaping Use   Vaping status: Never Used  Substance Use Topics   Alcohol use: Yes    Alcohol/week: 5.0 standard drinks of alcohol    Types: 5 Cans of beer per week   Drug use: No     Allergies  Allergen Reactions   Penicillins     REACTION: hives and itching as child    Review of Systems NEGATIVE UNLESS OTHERWISE INDICATED IN HPI      Objective:     BP 102/66 (BP Location: Left Arm, Patient Position: Sitting, Cuff Size: Large)   Pulse 80   Temp 98.2 F (36.8 C) (Temporal)   Ht 5\' 10"  (1.778 m)   Wt 215 lb 9.6 oz (97.8 kg)   SpO2 97%   BMI 30.94 kg/m   Wt Readings from Last 3 Encounters:  03/29/24 215 lb 9.6 oz (97.8 kg)  12/21/23 214 lb 9.6 oz (97.3 kg)  09/22/23 213 lb 12.8 oz (97 kg)  BP Readings from Last 3 Encounters:  03/29/24 102/66  12/21/23 118/68  09/22/23 122/84     Physical Exam Vitals and nursing note reviewed.  Constitutional:      Appearance: Normal appearance.  Eyes:     Extraocular Movements: Extraocular movements intact.     Conjunctiva/sclera: Conjunctivae normal.     Pupils: Pupils are equal, round, and reactive to light.  Cardiovascular:     Rate and Rhythm: Normal rate and regular rhythm.  Pulmonary:     Effort: Pulmonary effort is normal.     Breath sounds: Normal breath sounds.  Neurological:     General: No focal deficit present.     Mental Status: He is alert and oriented to person, place, and time.  Psychiatric:        Mood and Affect: Mood normal.        Behavior: Behavior normal.             Onie Hayashi M Yasheka Fossett, PA-C

## 2024-06-15 ENCOUNTER — Other Ambulatory Visit (HOSPITAL_BASED_OUTPATIENT_CLINIC_OR_DEPARTMENT_OTHER): Payer: Self-pay | Admitting: Family Medicine

## 2024-06-15 DIAGNOSIS — E782 Mixed hyperlipidemia: Secondary | ICD-10-CM

## 2024-06-20 ENCOUNTER — Ambulatory Visit: Payer: 59 | Admitting: Physician Assistant

## 2024-06-21 LAB — LAB REPORT - SCANNED: EGFR: 62

## 2024-06-29 ENCOUNTER — Encounter: Payer: Self-pay | Admitting: Physician Assistant

## 2024-06-29 ENCOUNTER — Ambulatory Visit (INDEPENDENT_AMBULATORY_CARE_PROVIDER_SITE_OTHER): Admitting: Physician Assistant

## 2024-06-29 ENCOUNTER — Ambulatory Visit: Payer: Self-pay | Admitting: Physician Assistant

## 2024-06-29 ENCOUNTER — Ambulatory Visit (HOSPITAL_COMMUNITY)
Admission: RE | Admit: 2024-06-29 | Discharge: 2024-06-29 | Disposition: A | Discharge status: Home / Self Care | Source: Ambulatory Visit | Attending: Physician Assistant | Admitting: Physician Assistant

## 2024-06-29 VITALS — BP 123/83 | HR 80 | Temp 97.7°F | Ht 70.0 in | Wt 217.6 lb

## 2024-06-29 DIAGNOSIS — R350 Frequency of micturition: Secondary | ICD-10-CM

## 2024-06-29 DIAGNOSIS — H539 Unspecified visual disturbance: Secondary | ICD-10-CM

## 2024-06-29 DIAGNOSIS — R1032 Left lower quadrant pain: Secondary | ICD-10-CM

## 2024-06-29 DIAGNOSIS — Z800 Family history of malignant neoplasm of digestive organs: Secondary | ICD-10-CM

## 2024-06-29 DIAGNOSIS — R5383 Other fatigue: Secondary | ICD-10-CM | POA: Insufficient documentation

## 2024-06-29 DIAGNOSIS — E781 Pure hyperglyceridemia: Secondary | ICD-10-CM

## 2024-06-29 DIAGNOSIS — R42 Dizziness and giddiness: Secondary | ICD-10-CM | POA: Diagnosis present

## 2024-06-29 DIAGNOSIS — R61 Generalized hyperhidrosis: Secondary | ICD-10-CM | POA: Insufficient documentation

## 2024-06-29 DIAGNOSIS — F902 Attention-deficit hyperactivity disorder, combined type: Secondary | ICD-10-CM

## 2024-06-29 DIAGNOSIS — R519 Headache, unspecified: Secondary | ICD-10-CM | POA: Insufficient documentation

## 2024-06-29 DIAGNOSIS — D582 Other hemoglobinopathies: Secondary | ICD-10-CM

## 2024-06-29 LAB — CBC WITH DIFFERENTIAL/PLATELET
Basophils Absolute: 0 K/uL (ref 0.0–0.1)
Basophils Relative: 0.4 % (ref 0.0–3.0)
Eosinophils Absolute: 0.5 K/uL (ref 0.0–0.7)
Eosinophils Relative: 5.2 % — ABNORMAL HIGH (ref 0.0–5.0)
HCT: 46.7 % (ref 39.0–52.0)
Hemoglobin: 15.9 g/dL (ref 13.0–17.0)
Lymphocytes Relative: 19.8 % (ref 12.0–46.0)
Lymphs Abs: 1.8 K/uL (ref 0.7–4.0)
MCHC: 34 g/dL (ref 30.0–36.0)
MCV: 89.4 fl (ref 78.0–100.0)
Monocytes Absolute: 0.7 K/uL (ref 0.1–1.0)
Monocytes Relative: 8.2 % (ref 3.0–12.0)
Neutro Abs: 6 K/uL (ref 1.4–7.7)
Neutrophils Relative %: 66.4 % (ref 43.0–77.0)
Platelets: 235 K/uL (ref 150.0–400.0)
RBC: 5.22 Mil/uL (ref 4.22–5.81)
RDW: 14 % (ref 11.5–15.5)
WBC: 9 K/uL (ref 4.0–10.5)

## 2024-06-29 LAB — POC URINALSYSI DIPSTICK (AUTOMATED)
Bilirubin, UA: NEGATIVE
Blood, UA: NEGATIVE
Glucose, UA: NEGATIVE
Ketones, UA: NEGATIVE
Leukocytes, UA: NEGATIVE
Nitrite, UA: NEGATIVE
Protein, UA: NEGATIVE
Spec Grav, UA: >=1.03 — AB (ref 1.010–1.025)
Urobilinogen, UA: 0.2 U/dL
pH, UA: 6 (ref 5.0–8.0)

## 2024-06-29 LAB — COMPREHENSIVE METABOLIC PANEL WITH GFR
ALT: 24 U/L (ref 0–53)
AST: 16 U/L (ref 0–37)
Albumin: 4.7 g/dL (ref 3.5–5.2)
Alkaline Phosphatase: 68 U/L (ref 39–117)
BUN: 16 mg/dL (ref 6–23)
CO2: 26 meq/L (ref 19–32)
Calcium: 9.4 mg/dL (ref 8.4–10.5)
Chloride: 104 meq/L (ref 96–112)
Creatinine, Ser: 1.09 mg/dL (ref 0.40–1.50)
GFR: 82.73 mL/min (ref >60.00)
Glucose, Bld: 85 mg/dL (ref 70–99)
Potassium: 3.8 meq/L (ref 3.5–5.1)
Sodium: 139 meq/L (ref 135–145)
Total Bilirubin: 0.5 mg/dL (ref 0.2–1.2)
Total Protein: 7.1 g/dL (ref 6.0–8.3)

## 2024-06-29 LAB — HEMOGLOBIN A1C: Hgb A1c MFr Bld: 5.8 % (ref 4.6–6.5)

## 2024-06-29 LAB — SEDIMENTATION RATE: Sed Rate: 1 mm/h (ref 0–15)

## 2024-06-29 LAB — C-REACTIVE PROTEIN: CRP: <1 mg/dL (ref 0.5–20.0)

## 2024-06-29 LAB — TSH: TSH: 2.73 u[IU]/mL (ref 0.35–5.50)

## 2024-06-29 LAB — LIPASE: Lipase: 29 U/L (ref 11.0–59.0)

## 2024-06-29 LAB — AMYLASE: Amylase: 39 U/L (ref 27–131)

## 2024-06-29 MED ORDER — IOHEXOL 300 MG/ML  SOLN
100.0000 mL | Freq: Once | INTRAMUSCULAR | Status: AC | PRN
  Administered 2024-06-29: 100 mL via INTRAVENOUS

## 2024-06-29 MED ORDER — GADOBUTROL 1 MMOL/ML IV SOLN
10.0000 mL | Freq: Once | INTRAVENOUS | Status: AC | PRN
  Administered 2024-06-29: 10 mL via INTRAVENOUS

## 2024-06-29 MED ORDER — AMPHETAMINE-DEXTROAMPHET ER 20 MG PO CP24: 20.0000 mg | ORAL_CAPSULE | Freq: Two times a day (BID) | ORAL | 0 refills | Status: DC

## 2024-06-29 NOTE — Progress Notes (Signed)
 Patient ID: Jesse Arellano, male    DOB: December 29, 1980, 43 y.o.   MRN: [id number]   Assessment & Plan:  Night sweats -     CBC with Differential/Platelet -     Comprehensive metabolic panel with GFR -     Hemoglobin A1c -     TSH -     Fecal occult blood, imunochemical; Future -     HIV Antibody (routine testing w rflx) -     Hepatitis C antibody -     Sedimentation rate -     C-reactive protein -     POCT Urinalysis Dipstick (Automated) -     MR BRAIN W WO CONTRAST; Future -     CT ABDOMEN PELVIS W CONTRAST; Future  Left lower quadrant abdominal pain -     CBC with Differential/Platelet -     CT ABDOMEN PELVIS W CONTRAST; Future  Frequent urination -     Fecal occult blood, imunochemical; Future -     CT ABDOMEN PELVIS W CONTRAST; Future  New onset of headaches -     MR BRAIN W WO CONTRAST; Future  Other fatigue -     CBC with Differential/Platelet -     Comprehensive metabolic panel with GFR -     Hemoglobin A1c -     TSH -     Fecal occult blood, imunochemical; Future -     HIV Antibody (routine testing w rflx) -     Hepatitis C antibody -     Sedimentation rate -     C-reactive protein -     POCT Urinalysis Dipstick (Automated) -     CT ABDOMEN PELVIS W CONTRAST; Future  Changes in vision -     MR BRAIN W WO CONTRAST; Future  Dizziness, nonspecific -     CBC with Differential/Platelet -     Comprehensive metabolic panel with GFR -     Hemoglobin A1c -     TSH -     Fecal occult blood, imunochemical; Future -     HIV Antibody (routine testing w rflx) -     Hepatitis C antibody -     Sedimentation rate -     C-reactive protein -     POCT Urinalysis Dipstick (Automated) -     MR BRAIN W WO CONTRAST; Future  Attention deficit hyperactivity disorder (ADHD), combined type -     Amphetamine -Dextroamphet ER; Take 1 capsule (20 mg total) by mouth in the morning and at bedtime.  Dispense: 60 capsule; Refill: 0  High triglycerides  Elevated hemoglobin  (HCC)  Family history of pancreatic cancer -     Lipase -     Amylase -     CT ABDOMEN PELVIS W CONTRAST; Future    Assessment & Plan Left lower quadrant abdominal pain, possible diverticulitis Left lower quadrant abdominal pain persisting for five to six days, with tenderness upon examination. Pain is dull, achy, and mostly on the left side, radiating slightly. Symptoms align with possible diverticulitis, especially given family history of diverticulitis. - Order CT abdomen and pelvis to evaluate for diverticulitis or other abnormalities - Run urine test to check for any urinary abnormalities - Check white blood cell count to assess for infection  Episodic blurred vision and diplopia Episodes of blurred vision and diplopia occurring weekly, sometimes twice a week, lasting 30 seconds to 2 minutes. Symptoms ceased after discontinuing atorvastatin . Differential includes transient ischemic attacks (TIAs) or ischemia. -  Order MRI brain to evaluate for TIAs or other neurological causes  Hypertriglyceridemia Triglycerides elevated to 649 mg/dL, significantly higher than previous levels. Concerns about potential pancreatitis due to high triglycerides and recent abdominal pain. - Check lipase and amylase levels to assess pancreatic function  Fatigue and insomnia Fatigue and insomnia possibly related to high humidity and poor sleep conditions at work.  Polyuria and urinary frequency Increased urinary frequency and sensation of incomplete bladder emptying. No significant changes in caffeine intake or diet noted.  ADHD PDMP reviewed today, no red flags, filling appropriately.  Stable, continue Adderall XR 20 mg BID       Return in about 3 months (around 09/29/2024) for recheck/follow-up.    Subjective:    Chief Complaint  Patient presents with   Annual Exam    Pt her for Annual Exam and is nit currently fasting     HPI Discussed the use of AI scribe software for clinical note  transcription with the patient, who gave verbal consent to proceed.  History of Present Illness Jesse Arellano is a 43 year old male who presents with episodes of blurred and crossed vision.  He experiences episodes of blurred and crossed vision that began after starting a statin medication. These episodes lasted from 30 seconds to 2 minutes and occurred once or twice a week, sometimes while driving, necessitating him to pull over. He describes the episodes as 'rapid onset, boom, eyes crossing vision, blurry.' He stopped the statin six weeks ago and has not experienced any episodes since. He has not had any eye exams since late March or early April, but his prescription changed slightly from 1.25 to 1.5. Associated symptoms include dizziness, an off-balance feeling, and one episode of nausea. No syncope, some headaches. Family history includes hypertension and high cholesterol but no history of TIA or stroke.  He experienced lower abdominal pain about three to four weeks ago, lasting five to six days. The pain was dull, achy, and located mostly on the left side, sometimes radiating across the abdomen. He reports changes in bowel habits, with more frequent and softer stools, and increased frequency of urination without fully emptying his bladder. No blood in stool or significant weight loss but notes some night sweats and fatigue. Family history includes pancreatic cancer (father) and diverticulitis (mother). He has not had a colonoscopy yet, as he is due for one next year at age 78.  His current medications include Adderall, which he takes daily, and he has not been taking his acid reflux medication as he has not needed it recently. He consumes a high amount of caffeine daily, including two cups of coffee in the morning and several sodas throughout the day. 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     Past Medical  History:  Diagnosis Date   Anxiety    Asthma    allergy induced in childhood   Depression    Family history of pancreatic cancer    GERD (gastroesophageal reflux disease)    Sleep apnea     Past Surgical History:  Procedure Laterality Date   NASAL SEPTOPLASTY W/ TURBINOPLASTY Bilateral 01/22/2019   Procedure: NASAL SEPTOPLASTY WITH BILATERAL TURBINATE REDUCTION;  Surgeon: Karis Clunes, MD;  Location: [facility];  Service: ENT;  Laterality: Bilateral;   WISDOM TOOTH EXTRACTION      Family History  Problem Relation Age of Onset   Diabetes Mother  Cancer Father 19       pancreatic   Diabetes Sister    Cancer Sister 10       thyroid    Kidney cancer Sister 45   Diabetes Sister    Lung cancer Maternal Aunt    Lung cancer Paternal Aunt    Cancer Paternal Uncle        unk type   Lung cancer Maternal Grandfather    Lung cancer Paternal Grandmother    Behavior problems Daughter     Social History   Tobacco Use   Smoking status: Every Day    Current packs/day: 1.00    Types: Cigarettes   Smokeless tobacco: Current  Vaping Use   Vaping status: Never Used  Substance Use Topics   Alcohol use: Yes    Alcohol/week: 5.0 standard drinks of alcohol    Types: 5 Cans of beer per week   Drug use: No     Allergies  Allergen Reactions   Penicillins     REACTION: hives and itching as child    Review of Systems NEGATIVE UNLESS OTHERWISE INDICATED IN HPI      Objective:     BP 123/83   Pulse 80   Temp 97.7 F (36.5 C)   Ht 5' 10 (1.778 m)   Wt 217 lb 9.6 oz (98.7 kg)   SpO2 95%   BMI 31.22 kg/m   Wt Readings from Last 3 Encounters:  06/29/24 217 lb 9.6 oz (98.7 kg)  03/29/24 215 lb 9.6 oz (97.8 kg)  12/21/23 214 lb 9.6 oz (97.3 kg)    BP Readings from Last 3 Encounters:  06/29/24 123/83  03/29/24 102/66  12/21/23 118/68     Physical Exam Vitals and nursing note reviewed.  Constitutional:      General: He is not in acute distress.     Appearance: Normal appearance. He is not toxic-appearing.  HENT:     Head: Normocephalic and atraumatic.     Right Ear: Tympanic membrane, ear canal and external ear normal.     Left Ear: Tympanic membrane, ear canal and external ear normal.     Nose: Nose normal.     Mouth/Throat:     Mouth: Mucous membranes are moist.     Pharynx: Oropharynx is clear.  Eyes:     Extraocular Movements: Extraocular movements intact.     Conjunctiva/sclera: Conjunctivae normal.     Pupils: Pupils are equal, round, and reactive to light.  Cardiovascular:     Rate and Rhythm: Normal rate and regular rhythm.     Pulses: Normal pulses.     Heart sounds: Normal heart sounds.  Pulmonary:     Effort: Pulmonary effort is normal.     Breath sounds: Normal breath sounds.  Abdominal:     General: Abdomen is flat. Bowel sounds are normal.     Palpations: Abdomen is soft.     Tenderness: There is abdominal tenderness (left lower quadrant). There is no right CVA tenderness, left CVA tenderness or guarding.     Hernia: No hernia is present.  Musculoskeletal:        General: Normal range of motion.     Cervical back: Normal range of motion and neck supple.     Right lower leg: No edema.     Left lower leg: No edema.  Skin:    General: Skin is warm and dry.  Neurological:     General: No focal deficit present.     Mental  Status: He is alert and oriented to person, place, and time.     Sensory: No sensory deficit.     Motor: No weakness.     Gait: Gait normal.  Psychiatric:        Mood and Affect: Mood normal.        Behavior: Behavior normal.             Kaizen Ibsen M Denym Rahimi, PA-C

## 2024-06-30 LAB — HIV ANTIBODY (ROUTINE TESTING W REFLEX): HIV 1&2 Ab, 4th Generation: NONREACTIVE

## 2024-06-30 LAB — HEPATITIS C ANTIBODY: Hepatitis C Ab: NONREACTIVE

## 2024-07-02 NOTE — Telephone Encounter (Signed)
 FYI both MRI and CT completed, advise if anything further needed at this time

## 2024-07-18 ENCOUNTER — Ambulatory Visit (HOSPITAL_BASED_OUTPATIENT_CLINIC_OR_DEPARTMENT_OTHER)
Admission: RE | Admit: 2024-07-18 | Discharge: 2024-07-18 | Disposition: A | Discharge status: Home / Self Care | Payer: Self-pay | Source: Ambulatory Visit | Attending: Family Medicine | Admitting: Family Medicine

## 2024-07-18 DIAGNOSIS — E782 Mixed hyperlipidemia: Secondary | ICD-10-CM

## 2024-09-18 ENCOUNTER — Telehealth: Payer: Self-pay | Admitting: Physician Assistant

## 2024-09-18 ENCOUNTER — Encounter: Payer: Self-pay | Admitting: Physician Assistant

## 2024-09-18 NOTE — Telephone Encounter (Signed)
 Pt was scheduled to have had a CPE on 06/29/24 but it appears it was turned into an office visit. Pt now needs to have a CPE completed before 11/14/24 due to insurance premiums. Can we overbook to get him in? Please advise

## 2024-10-15 ENCOUNTER — Encounter: Payer: Self-pay | Admitting: Physician Assistant

## 2024-10-15 ENCOUNTER — Ambulatory Visit (INDEPENDENT_AMBULATORY_CARE_PROVIDER_SITE_OTHER): Admitting: Physician Assistant

## 2024-10-15 VITALS — BP 124/78 | HR 88 | Temp 97.7°F | Ht 70.0 in | Wt 217.4 lb

## 2024-10-15 DIAGNOSIS — Z0000 Encounter for general adult medical examination without abnormal findings: Secondary | ICD-10-CM

## 2024-10-15 DIAGNOSIS — F17200 Nicotine dependence, unspecified, uncomplicated: Secondary | ICD-10-CM

## 2024-10-15 DIAGNOSIS — F902 Attention-deficit hyperactivity disorder, combined type: Secondary | ICD-10-CM

## 2024-10-15 DIAGNOSIS — R931 Abnormal findings on diagnostic imaging of heart and coronary circulation: Secondary | ICD-10-CM

## 2024-10-15 DIAGNOSIS — I77810 Thoracic aortic ectasia: Secondary | ICD-10-CM | POA: Diagnosis not present

## 2024-10-15 DIAGNOSIS — E781 Pure hyperglyceridemia: Secondary | ICD-10-CM

## 2024-10-15 DIAGNOSIS — K219 Gastro-esophageal reflux disease without esophagitis: Secondary | ICD-10-CM

## 2024-10-15 DIAGNOSIS — H539 Unspecified visual disturbance: Secondary | ICD-10-CM

## 2024-10-15 DIAGNOSIS — E782 Mixed hyperlipidemia: Secondary | ICD-10-CM

## 2024-10-15 DIAGNOSIS — K760 Fatty (change of) liver, not elsewhere classified: Secondary | ICD-10-CM | POA: Insufficient documentation

## 2024-10-15 NOTE — Progress Notes (Signed)
 Patient ID: Jesse Arellano, male    DOB: 1981-06-15, 43 y.o.   MRN: [id number]   Assessment & Plan:  Encounter for annual physical exam  Abnormal screening cardiac CT -     Ambulatory referral to Cardiology  Mild ascending aorta dilation -     Ambulatory referral to Cardiology  High triglycerides -     Ambulatory referral to Cardiology  Mixed hyperlipidemia -     Ambulatory referral to Cardiology  Changes in vision  Smoker  Hepatic steatosis  Gastroesophageal reflux disease, unspecified whether esophagitis present     Assessment & Plan Adult Wellness Visit Annual physical exam conducted. Blood pressure is well-controlled. No new skin lesions or rashes. Alcohol consumption is moderate. Sleep patterns are variable but improved recently. No significant stress reported. No issues with bowel movements. Prostate health is stable with normal PSA levels. - Will schedule colon cancer screening next year.  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 Coronary artery calcium  score is 98, placing him in the 95th percentile for age, indicating high risk for coronary artery disease. No new chest pain or symptoms reported. Cardiologist referral recommended for further evaluation and management. - Referred to cardiology for evaluation of aortic dilation and coronary artery calcium  score. - Continue atorvastatin  therapy.  Mixed hyperlipidemia and pure hyperglyceridemia Cholesterol levels previously elevated. Currently on atorvastatin  therapy. Cardiologist referral for further management of lipid levels. - Continue atorvastatin  therapy. - Referred to cardiology for further management of lipid levels.  Nicotine  dependence Continues to smoke approximately two packs per day. Plans to quit smoking with spouse by reducing cigarette consumption and using nicotine  patches. Acknowledges the need to decrease  nicotine  intake gradually. - Encouraged reduction in cigarette consumption. - Plan to use nicotine  patches for smoking cessation.  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 Symptoms include blurred vision and cross vision lasting 30 seconds to 1.5 minutes. No associated dizziness or balance issues. Regular eye exams are up to date. Neurology referral considered if symptoms persist or worsen. - Discuss symptoms with ophthalmologist during next eye exam in February. - Will consider neurology referral if symptoms persist or worsen.  Fatty liver (hepatic steatosis) Fatty liver noted on recent CT scan. No other concerns noted on the scan. Dietary changes advised.   Gastroesophageal reflux disease (GERD) GERD symptoms have been managed without medication for the past two to three months. Symptoms are related to dietary choices, particularly tomatoes, red sauce, and sugary candy. - Continue dietary modifications to manage GERD symptoms.  ADHD  Stable    Patient Counseling: [x]   Nutrition: Stressed importance of moderation in sodium/caffeine intake, saturated fat and cholesterol, caloric balance, sufficient intake of fresh fruits, vegetables, and fiber.  [x]   Stressed the importance of regular exercise.   [x]   Substance Abuse: Discussed cessation/primary prevention of tobacco, alcohol, or other drug use; driving or other dangerous activities under the influence; availability of treatment for abuse.   []   Injury prevention: Discussed safety belts, safety helmets, smoke detector, smoking near bedding or upholstery.   []   Sexuality: Discussed sexually transmitted diseases, partner selection, use of condoms, avoidance of unintended pregnancy  and contraceptive alternatives.   [x]   Dental health: Discussed importance of regular tooth brushing, flossing, and dental visits.  [x]   Health  maintenance and immunizations reviewed. Please refer to Health maintenance section.  Return in about 3 months (around 01/13/2025) for recheck/follow-up.    Subjective:    Chief Complaint  Patient presents with   Annual Exam    Pt in office for annual CPE, labs previously done this year in August; pt previous vist couldn't count as Cpe since MRI and CT scan was ordered.     HPI Discussed the use of AI scribe software for clinical note transcription with the patient, who gave verbal consent to proceed.  History of Present Illness Jesse Arellano is a 43 year old male who presents for his annual physical exam.  Since his last visit, he underwent a CT of the abdomen and pelvis with contrast, which revealed fatty liver. An MRI of the brain with and without contrast was performed after he reported symptoms at his last visit.  A CT cardiac score as part of an occupational health check showed a mildly dilated ascending aorta measuring 4.1 cm and a coronary calcium  score of 98, placing him in the 95th percentile for his age.  He recently had a viral illness, for which he took a five-day course of prednisone , ipratropium bromide nasal spray, and used an albuterol inhaler. He feels better but still has some congestion, particularly at night.  He experiences episodes of vision changes, described as blurred or crossed vision lasting 30 to 90 seconds, occurring every few days. These episodes are not associated with headache or pain, and he has not experienced dizziness outside of these episodes. He has a history of occipital migraines without pain.  He has not been taking his acid reflux medication for two to three months and has had a few episodes of symptoms, which he notes are related to dietary choices, particularly tomato and red sauce, as well as sugary candy.  He smokes and is considering quitting with his wife, planning to use nicotine  patches and reduce smoking gradually. He  drinks alcohol every third day, consuming four to five beers each time. He reports a high caffeine intake and frequent urination, but not at night. He has a history of overactive gastrointestinal symptoms, which he attributes to his diet and caffeine intake. No new chest pain, no new exercise-related symptoms, no fever, no dizziness outside of vision change episodes, no balance issues, no skin rashes or concerning moles, no bowel issues.     Past Medical History:  Diagnosis Date   Allergy    Anxiety    Asthma    allergy induced in childhood   Depression    Family history of pancreatic cancer    GERD (gastroesophageal reflux disease)    Sleep apnea     Past Surgical History:  Procedure Laterality Date   NASAL SEPTOPLASTY W/ TURBINOPLASTY Bilateral 01/22/2019   Procedure: NASAL SEPTOPLASTY WITH BILATERAL TURBINATE REDUCTION;  Surgeon: Karis Clunes, MD;  Location: [facility];  Service: ENT;  Laterality: Bilateral;   WISDOM TOOTH EXTRACTION      Family History  Problem Relation Age of Onset   Diabetes Mother    Cancer Father 48       pancreatic   Diabetes Sister    Cancer Sister 67       thyroid    Kidney cancer Sister 10   Diabetes Sister    Lung cancer Maternal Aunt    Lung cancer Paternal Aunt    Cancer Paternal Uncle        unk type   Lung cancer Maternal Grandfather    Lung cancer Paternal Grandmother  Behavior problems Daughter     Social History   Tobacco Use   Smoking status: Every Day    Current packs/day: 1.00    Types: Cigarettes   Smokeless tobacco: Current  Vaping Use   Vaping status: Never Used  Substance Use Topics   Alcohol use: Yes    Alcohol/week: 5.0 standard drinks of alcohol    Types: 5 Cans of beer per week   Drug use: No     Allergies  Allergen Reactions   Penicillins     REACTION: hives and itching as child    Review of Systems NEGATIVE UNLESS OTHERWISE INDICATED IN HPI      Objective:     BP 124/78 (BP Location:  Left Arm, Patient Position: Sitting, Cuff Size: Normal)   Pulse 88   Temp 97.7 F (36.5 C) (Temporal)   Ht 5' 10 (1.778 m)   Wt 217 lb 6.4 oz (98.6 kg)   SpO2 97%   BMI 31.19 kg/m   Wt Readings from Last 3 Encounters:  10/15/24 217 lb 6.4 oz (98.6 kg)  06/29/24 217 lb 9.6 oz (98.7 kg)  03/29/24 215 lb 9.6 oz (97.8 kg)    BP Readings from Last 3 Encounters:  10/15/24 124/78  06/29/24 123/83  03/29/24 102/66     Physical Exam Vitals and nursing note reviewed.  Constitutional:      General: He is not in acute distress.    Appearance: Normal appearance. He is not toxic-appearing.  HENT:     Head: Normocephalic and atraumatic.     Right Ear: Tympanic membrane, ear canal and external ear normal.     Left Ear: Tympanic membrane, ear canal and external ear normal.     Nose: Nose normal.     Mouth/Throat:     Mouth: Mucous membranes are moist.     Pharynx: Oropharynx is clear.  Eyes:     Extraocular Movements: Extraocular movements intact.     Conjunctiva/sclera: Conjunctivae normal.     Pupils: Pupils are equal, round, and reactive to light.  Cardiovascular:     Rate and Rhythm: Normal rate and regular rhythm.     Pulses: Normal pulses.     Heart sounds: Normal heart sounds.  Pulmonary:     Effort: Pulmonary effort is normal.     Breath sounds: Normal breath sounds.  Abdominal:     General: Abdomen is flat. Bowel sounds are normal.     Palpations: Abdomen is soft.  Musculoskeletal:        General: Normal range of motion.     Cervical back: Normal range of motion and neck supple.     Right lower leg: No edema.     Left lower leg: No edema.  Skin:    General: Skin is warm and dry.     Findings: No lesion or rash.  Neurological:     General: No focal deficit present.     Mental Status: He is alert and oriented to person, place, and time.  Psychiatric:        Mood and Affect: Mood normal.        Behavior: Behavior normal.             Zaccai Chavarin M Omri Bertran,  PA-C

## 2024-11-29 ENCOUNTER — Encounter: Payer: Self-pay | Admitting: Physician Assistant

## 2024-11-29 ENCOUNTER — Other Ambulatory Visit: Payer: Self-pay

## 2024-11-29 DIAGNOSIS — F902 Attention-deficit hyperactivity disorder, combined type: Secondary | ICD-10-CM

## 2024-11-29 MED ORDER — AMPHETAMINE-DEXTROAMPHET ER 20 MG PO CP24: 20.0000 mg | ORAL_CAPSULE | Freq: Two times a day (BID) | ORAL | 0 refills | Status: AC

## 2024-11-29 NOTE — Telephone Encounter (Signed)
 Last OV: 10/15/25  Next OV: 01/14/25  Last filled: 06/29/24  Quantity: 60

## 2024-12-12 ENCOUNTER — Ambulatory Visit: Admitting: Family

## 2024-12-12 ENCOUNTER — Ambulatory Visit: Admitting: Physician Assistant

## 2024-12-12 ENCOUNTER — Encounter: Payer: Self-pay | Admitting: Family

## 2024-12-12 VITALS — BP 120/78 | HR 75 | Temp 99.5°F | Ht 70.0 in | Wt 214.2 lb

## 2024-12-12 DIAGNOSIS — R059 Cough, unspecified: Secondary | ICD-10-CM

## 2024-12-12 DIAGNOSIS — M791 Myalgia, unspecified site: Secondary | ICD-10-CM

## 2024-12-12 LAB — POCT INFLUENZA A/B
Influenza A, POC: NEGATIVE
Influenza B, POC: NEGATIVE

## 2024-12-12 LAB — POC COVID19 BINAXNOW: SARS Coronavirus 2 Ag: NEGATIVE

## 2024-12-12 MED ORDER — NAPROXEN 500 MG PO TABS: 500.0000 mg | ORAL_TABLET | Freq: Two times a day (BID) | ORAL | 0 refills | Status: AC

## 2024-12-12 NOTE — Progress Notes (Signed)
 "  Patient ID: Jesse Arellano, male    DOB: Dec 02, 1980, 44 y.o.   MRN: [id number]  Chief Complaint  Patient presents with   Sore Throat    Pt c/o sore throat, dry cough, body aches, slight fever. Sx present on Monday. Has tried ibuprofen, acetaminophen  and tessalon  perles, which did help slightly.    Discussed the use of AI scribe software for clinical note transcription with the patient, who gave verbal consent to proceed.  History of Present Illness Jesse Arellano is a 44 year old male with asthma who presents with symptoms of a viral respiratory infection.  Symptoms began Sunday night with a scratchy throat and progressed to a dry, nonproductive cough by Monday. He continued working at the fire station and feels cold exposure worsened his symptoms. By early Tuesday he had severe cold sweats with alternating hot and cold sensations and suspected fever but did not measure it due to unreliable thermometers at work. He has been taking Tessalon  Perles since November with partial relief of cough. He now has significant body aches, chills, poor appetite, and fatigue. He has asthma that worsens with illness and allergies. He uses a rescue albuterol inhaler during illness and is not on a daily inhaler or recent steroids. He uses nasal sprays. His youngest daughter had flu two weeks ago. He has not received a flu shot this season. He denies ear pain but notes intermittent clogging or muffled hearing. He denies sinus pressure or drainage with this illness, which differs from prior viral infections. He has not taken medication for fever or pain before today.  Assessment & Plan Acute upper respiratory infection Negative for COVID-19 and influenza. Likely viral infection exacerbating asthma and working out in the cold weather. Lungs clear on exam.  - Continue Tessalon  Perles for cough prn. - Use albuterol inhaler as needed and before bedtime. - Prescribed naproxen  500mg  for body aches for next  few days. - Encouraged increased fluid intake, at least 2.5-3 liters - Advised to monitor symptoms and report if worsens by Friday or Monday.  Asthma Exacerbated by respiratory infections and cold weather. Discussed Air Supra for better control but not initiated due to insurance concerns. - Continue albuterol inhaler as needed. - Production [employer], [employer] for future exacerbations if insurance allows.  Subjective:    Outpatient Medications Prior to Visit  Medication Sig Dispense Refill   albuterol (VENTOLIN HFA) 108 (90 Base) MCG/ACT inhaler Inhale 2 puffs into the lungs every 4 (four) hours as needed for wheezing or shortness of breath.     amphetamine -dextroamphetamine  (ADDERALL XR) 20 MG 24 hr capsule Take 1 capsule (20 mg total) by mouth in the morning and at bedtime. 60 capsule 0   atorvastatin  (LIPITOR) 20 MG tablet Take 1 tablet (20 mg total) by mouth daily. 90 tablet 1   benzonatate  (TESSALON ) 200 MG capsule Take 200 mg by mouth 3 (three) times daily as needed.     ipratropium (ATROVENT) 0.03 % nasal spray Place 2 sprays into both nostrils 2 (two) times daily.     pantoprazole  (PROTONIX ) 20 MG tablet Take 1 tablet (20 mg total) by mouth daily. Take on an empty stomach. 30 tablet 2   No facility-administered medications prior to visit.   Past Medical History:  Diagnosis Date   Allergy    Anxiety    Asthma    allergy induced in childhood   Depression    Family history of pancreatic cancer    GERD (gastroesophageal reflux disease)  Sleep apnea    Past Surgical History:  Procedure Laterality Date   NASAL SEPTOPLASTY W/ TURBINOPLASTY Bilateral 01/22/2019   Procedure: NASAL SEPTOPLASTY WITH BILATERAL TURBINATE REDUCTION;  Surgeon: Karis Clunes, MD;  Location: [facility];  Service: ENT;  Laterality: Bilateral;   WISDOM TOOTH EXTRACTION     Allergies[1]    Objective:    Physical Exam Vitals and nursing note reviewed.  Constitutional:      General: He is not in acute  distress.    Appearance: Normal appearance.  HENT:     Head: Normocephalic.     Right Ear: Tympanic membrane and ear canal normal.     Left Ear: Tympanic membrane and ear canal normal.     Nose:     Right Sinus: Frontal sinus tenderness present. No maxillary sinus tenderness.     Left Sinus: Frontal sinus tenderness present. No maxillary sinus tenderness.     Mouth/Throat:     Mouth: Mucous membranes are moist.     Pharynx: Oropharyngeal exudate present. No pharyngeal swelling, posterior oropharyngeal erythema or uvula swelling.     Tonsils: No tonsillar exudate or tonsillar abscesses.  Cardiovascular:     Rate and Rhythm: Normal rate and regular rhythm.  Pulmonary:     Effort: Pulmonary effort is normal.     Breath sounds: Normal breath sounds.  Musculoskeletal:        General: Normal range of motion.     Cervical back: Normal range of motion.  Lymphadenopathy:     Head:     Right side of head: No preauricular or posterior auricular adenopathy.     Left side of head: No preauricular or posterior auricular adenopathy.     Cervical: No cervical adenopathy.  Skin:    General: Skin is warm and dry.  Neurological:     Mental Status: He is alert and oriented to person, place, and time.  Psychiatric:        Mood and Affect: Mood normal.    BP 120/78 (BP Location: Left Arm, Patient Position: Sitting, Cuff Size: Normal)   Pulse 75   Temp 99.5 F (37.5 C) (Temporal)   Ht 5' 10 (1.778 m)   Wt 214 lb 4 oz (97.2 kg)   SpO2 95%   BMI 30.74 kg/m  Wt Readings from Last 3 Encounters:  12/12/24 214 lb 4 oz (97.2 kg)  10/15/24 217 lb 6.4 oz (98.6 kg)  06/29/24 217 lb 9.6 oz (98.7 kg)      Vedh Ptacek, NP     [1]  Allergies Allergen Reactions   Penicillins     REACTION: hives and itching as child   "

## 2025-01-14 ENCOUNTER — Ambulatory Visit: Admitting: Physician Assistant

## 2025-01-23 ENCOUNTER — Ambulatory Visit: Admitting: Student in an Organized Health Care Education/Training Program

## 2025-07-04 ENCOUNTER — Encounter: Admitting: Physician Assistant
